# Patient Record
Sex: Female | Born: 1940
Health system: Southern US, Community
[De-identification: ages and names within clinical notes are randomized; demographics above are authoritative.]

## PROBLEM LIST (undated history)

## (undated) DIAGNOSIS — C801 Malignant (primary) neoplasm, unspecified: Secondary | ICD-10-CM

## (undated) DIAGNOSIS — E785 Hyperlipidemia, unspecified: Secondary | ICD-10-CM

## (undated) DIAGNOSIS — R7309 Other abnormal glucose: Secondary | ICD-10-CM

## (undated) DIAGNOSIS — R42 Dizziness and giddiness: Secondary | ICD-10-CM

## (undated) DIAGNOSIS — M2550 Pain in unspecified joint: Secondary | ICD-10-CM

## (undated) DIAGNOSIS — N19 Unspecified kidney failure: Secondary | ICD-10-CM

## (undated) DIAGNOSIS — Z853 Personal history of malignant neoplasm of breast: Secondary | ICD-10-CM

## (undated) DIAGNOSIS — Z87891 Personal history of nicotine dependence: Secondary | ICD-10-CM

## (undated) DIAGNOSIS — I10 Essential (primary) hypertension: Secondary | ICD-10-CM

## (undated) DIAGNOSIS — Z780 Asymptomatic menopausal state: Secondary | ICD-10-CM

## (undated) HISTORY — DX: Pain in unspecified joint: M25.50

## (undated) HISTORY — DX: Personal history of malignant neoplasm of breast: Z85.3

## (undated) HISTORY — DX: Other abnormal glucose: R73.09

## (undated) HISTORY — DX: Unspecified kidney failure: N19

## (undated) HISTORY — DX: Hyperlipidemia, unspecified: E78.5

## (undated) HISTORY — DX: Essential (primary) hypertension: I10

## (undated) HISTORY — DX: Asymptomatic menopausal state: Z78.0

## (undated) HISTORY — DX: Personal history of nicotine dependence: Z87.891

## (undated) HISTORY — DX: Dizziness and giddiness: R42

## (undated) HISTORY — PX: BREAST LUMPECTOMY: SHX2

---

## 1994-09-14 HISTORY — PX: OTHER SURGICAL HISTORY: SHX169

## 1994-09-14 HISTORY — PX: CEREBRAL ANEURYSM REPAIR: SHX164

## 1997-12-05 ENCOUNTER — Ambulatory Visit (HOSPITAL_COMMUNITY): Admission: RE | Admit: 1997-12-05 | Discharge: 1997-12-05 | Payer: Self-pay | Admitting: Obstetrics and Gynecology

## 1998-11-01 ENCOUNTER — Encounter: Payer: Self-pay | Admitting: Emergency Medicine

## 1998-11-01 ENCOUNTER — Emergency Department (HOSPITAL_COMMUNITY): Admission: EM | Admit: 1998-11-01 | Discharge: 1998-11-01 | Payer: Self-pay | Admitting: Emergency Medicine

## 1999-02-27 ENCOUNTER — Other Ambulatory Visit: Admission: RE | Admit: 1999-02-27 | Discharge: 1999-02-27 | Payer: Self-pay | Admitting: Obstetrics and Gynecology

## 1999-02-27 ENCOUNTER — Encounter: Payer: Self-pay | Admitting: Obstetrics and Gynecology

## 1999-02-27 ENCOUNTER — Ambulatory Visit (HOSPITAL_COMMUNITY): Admission: RE | Admit: 1999-02-27 | Discharge: 1999-02-27 | Payer: Self-pay | Admitting: Obstetrics and Gynecology

## 2000-03-29 ENCOUNTER — Encounter: Payer: Self-pay | Admitting: Emergency Medicine

## 2000-03-29 ENCOUNTER — Emergency Department (HOSPITAL_COMMUNITY): Admission: EM | Admit: 2000-03-29 | Discharge: 2000-03-29 | Payer: Self-pay | Admitting: Emergency Medicine

## 2001-06-28 ENCOUNTER — Ambulatory Visit (HOSPITAL_COMMUNITY): Admission: RE | Admit: 2001-06-28 | Discharge: 2001-06-28 | Payer: Self-pay | Admitting: *Deleted

## 2001-07-01 ENCOUNTER — Encounter: Admission: RE | Admit: 2001-07-01 | Discharge: 2001-07-01 | Payer: Self-pay | Admitting: *Deleted

## 2001-07-01 ENCOUNTER — Encounter (INDEPENDENT_AMBULATORY_CARE_PROVIDER_SITE_OTHER): Payer: Self-pay | Admitting: *Deleted

## 2001-07-01 ENCOUNTER — Other Ambulatory Visit: Admission: RE | Admit: 2001-07-01 | Discharge: 2001-07-01 | Payer: Self-pay | Admitting: Radiology

## 2001-07-07 ENCOUNTER — Encounter: Admission: RE | Admit: 2001-07-07 | Discharge: 2001-07-07 | Payer: Self-pay | Admitting: General Surgery

## 2001-07-07 ENCOUNTER — Encounter: Payer: Self-pay | Admitting: General Surgery

## 2001-07-08 ENCOUNTER — Encounter: Payer: Self-pay | Admitting: General Surgery

## 2001-07-08 ENCOUNTER — Ambulatory Visit (HOSPITAL_BASED_OUTPATIENT_CLINIC_OR_DEPARTMENT_OTHER): Admission: RE | Admit: 2001-07-08 | Discharge: 2001-07-08 | Payer: Self-pay | Admitting: General Surgery

## 2001-07-08 ENCOUNTER — Encounter: Admission: RE | Admit: 2001-07-08 | Discharge: 2001-07-08 | Payer: Self-pay | Admitting: General Surgery

## 2001-07-08 ENCOUNTER — Encounter (INDEPENDENT_AMBULATORY_CARE_PROVIDER_SITE_OTHER): Payer: Self-pay | Admitting: *Deleted

## 2001-07-19 ENCOUNTER — Ambulatory Visit: Admission: RE | Admit: 2001-07-19 | Discharge: 2001-10-17 | Payer: Self-pay | Admitting: Radiation Oncology

## 2001-08-05 ENCOUNTER — Ambulatory Visit (HOSPITAL_COMMUNITY): Admission: RE | Admit: 2001-08-05 | Discharge: 2001-08-05 | Payer: Self-pay | Admitting: Oncology

## 2001-08-05 ENCOUNTER — Encounter: Payer: Self-pay | Admitting: Oncology

## 2001-08-22 ENCOUNTER — Ambulatory Visit (HOSPITAL_BASED_OUTPATIENT_CLINIC_OR_DEPARTMENT_OTHER): Admission: RE | Admit: 2001-08-22 | Discharge: 2001-08-22 | Payer: Self-pay | Admitting: General Surgery

## 2001-08-22 ENCOUNTER — Encounter: Payer: Self-pay | Admitting: General Surgery

## 2001-10-18 ENCOUNTER — Ambulatory Visit: Admission: RE | Admit: 2001-10-18 | Discharge: 2002-01-16 | Payer: Self-pay | Admitting: Radiation Oncology

## 2001-10-26 ENCOUNTER — Ambulatory Visit (HOSPITAL_BASED_OUTPATIENT_CLINIC_OR_DEPARTMENT_OTHER): Admission: RE | Admit: 2001-10-26 | Discharge: 2001-10-26 | Payer: Self-pay | Admitting: General Surgery

## 2002-05-17 ENCOUNTER — Ambulatory Visit (HOSPITAL_COMMUNITY): Admission: RE | Admit: 2002-05-17 | Discharge: 2002-05-17 | Payer: Self-pay | Admitting: Oncology

## 2002-06-29 ENCOUNTER — Encounter: Payer: Self-pay | Admitting: General Surgery

## 2002-06-29 ENCOUNTER — Encounter: Admission: RE | Admit: 2002-06-29 | Discharge: 2002-06-29 | Payer: Self-pay | Admitting: General Surgery

## 2003-07-02 ENCOUNTER — Encounter: Payer: Self-pay | Admitting: Oncology

## 2003-07-02 ENCOUNTER — Encounter: Admission: RE | Admit: 2003-07-02 | Discharge: 2003-07-02 | Payer: Self-pay | Admitting: Oncology

## 2004-07-02 ENCOUNTER — Encounter: Admission: RE | Admit: 2004-07-02 | Discharge: 2004-07-02 | Payer: Self-pay

## 2004-07-22 ENCOUNTER — Ambulatory Visit: Payer: Self-pay | Admitting: Endocrinology

## 2004-07-24 ENCOUNTER — Ambulatory Visit: Payer: Self-pay | Admitting: Endocrinology

## 2004-07-24 ENCOUNTER — Other Ambulatory Visit: Admission: RE | Admit: 2004-07-24 | Discharge: 2004-07-24 | Payer: Self-pay | Admitting: Endocrinology

## 2004-08-19 ENCOUNTER — Ambulatory Visit: Payer: Self-pay | Admitting: Oncology

## 2005-02-25 ENCOUNTER — Ambulatory Visit: Payer: Self-pay | Admitting: Oncology

## 2005-06-25 ENCOUNTER — Ambulatory Visit: Payer: Self-pay | Admitting: Endocrinology

## 2005-07-03 ENCOUNTER — Encounter: Admission: RE | Admit: 2005-07-03 | Discharge: 2005-07-03 | Payer: Self-pay | Admitting: Oncology

## 2005-08-20 ENCOUNTER — Ambulatory Visit: Payer: Self-pay | Admitting: Oncology

## 2006-02-07 ENCOUNTER — Ambulatory Visit: Payer: Self-pay | Admitting: Oncology

## 2006-07-06 ENCOUNTER — Encounter: Admission: RE | Admit: 2006-07-06 | Discharge: 2006-07-06 | Payer: Self-pay | Admitting: Endocrinology

## 2006-07-13 ENCOUNTER — Ambulatory Visit: Payer: Self-pay | Admitting: Endocrinology

## 2006-08-11 ENCOUNTER — Ambulatory Visit: Payer: Self-pay | Admitting: Oncology

## 2006-08-13 LAB — CANCER ANTIGEN 27.29: CA 27.29: 34 U/mL (ref 0–39)

## 2006-08-13 LAB — CBC WITH DIFFERENTIAL/PLATELET
Basophils Absolute: 0 10*3/uL (ref 0.0–0.1)
Eosinophils Absolute: 0.2 10*3/uL (ref 0.0–0.5)
HGB: 15 g/dL (ref 11.6–15.9)
LYMPH%: 25 % (ref 14.0–48.0)
MCV: 94.1 fL (ref 81.0–101.0)
MONO#: 0.4 10*3/uL (ref 0.1–0.9)
MONO%: 6.1 % (ref 0.0–13.0)
NEUT#: 4.4 10*3/uL (ref 1.5–6.5)
Platelets: 244 10*3/uL (ref 145–400)
RDW: 14.9 % — ABNORMAL HIGH (ref 11.3–14.5)
WBC: 6.8 10*3/uL (ref 3.9–10.0)

## 2006-08-13 LAB — COMPREHENSIVE METABOLIC PANEL
ALT: 13 U/L (ref 0–35)
CO2: 23 mEq/L (ref 19–32)
Calcium: 9.4 mg/dL (ref 8.4–10.5)
Chloride: 105 mEq/L (ref 96–112)
Glucose, Bld: 99 mg/dL (ref 70–99)
Sodium: 140 mEq/L (ref 135–145)
Total Bilirubin: 0.5 mg/dL (ref 0.3–1.2)
Total Protein: 7 g/dL (ref 6.0–8.3)

## 2007-06-18 ENCOUNTER — Encounter: Payer: Self-pay | Admitting: *Deleted

## 2007-06-18 DIAGNOSIS — Z853 Personal history of malignant neoplasm of breast: Secondary | ICD-10-CM | POA: Insufficient documentation

## 2007-06-18 DIAGNOSIS — I10 Essential (primary) hypertension: Secondary | ICD-10-CM

## 2007-06-18 DIAGNOSIS — E785 Hyperlipidemia, unspecified: Secondary | ICD-10-CM

## 2007-06-18 DIAGNOSIS — R7309 Other abnormal glucose: Secondary | ICD-10-CM

## 2007-06-18 DIAGNOSIS — R739 Hyperglycemia, unspecified: Secondary | ICD-10-CM | POA: Insufficient documentation

## 2007-06-18 HISTORY — DX: Essential (primary) hypertension: I10

## 2007-06-18 HISTORY — DX: Hyperlipidemia, unspecified: E78.5

## 2007-06-18 HISTORY — DX: Personal history of malignant neoplasm of breast: Z85.3

## 2007-06-18 HISTORY — DX: Other abnormal glucose: R73.09

## 2007-07-11 ENCOUNTER — Encounter: Admission: RE | Admit: 2007-07-11 | Discharge: 2007-07-11 | Payer: Self-pay | Admitting: Endocrinology

## 2008-01-12 ENCOUNTER — Ambulatory Visit: Payer: Self-pay | Admitting: Endocrinology

## 2008-01-12 DIAGNOSIS — M2550 Pain in unspecified joint: Secondary | ICD-10-CM

## 2008-01-12 HISTORY — DX: Pain in unspecified joint: M25.50

## 2008-01-12 LAB — CONVERTED CEMR LAB
ALT: 16 units/L (ref 0–35)
AST: 23 units/L (ref 0–37)
Basophils Absolute: 0.1 10*3/uL (ref 0.0–0.1)
Bilirubin, Direct: 0.1 mg/dL (ref 0.0–0.3)
CO2: 29 meq/L (ref 19–32)
Chloride: 101 meq/L (ref 96–112)
Cholesterol: 225 mg/dL (ref 0–200)
Creatinine, Ser: 1.3 mg/dL — ABNORMAL HIGH (ref 0.4–1.2)
Direct LDL: 140 mg/dL
GFR calc non Af Amer: 44 mL/min
HDL: 54.2 mg/dL (ref >39.0)
Hemoglobin: 13.4 g/dL (ref 12.0–15.0)
Hgb A1c MFr Bld: 5.6 % (ref 4.6–6.0)
Lymphocytes Relative: 22.3 % (ref 12.0–46.0)
MCHC: 34.2 g/dL (ref 30.0–36.0)
Monocytes Relative: 6.5 % (ref 3.0–12.0)
Mucus, UA: NEGATIVE
Neutro Abs: 5.8 10*3/uL (ref 1.4–7.7)
Neutrophils Relative %: 68.5 % (ref 43.0–77.0)
Nitrite: NEGATIVE
Platelets: 205 10*3/uL (ref 150–400)
RBC / HPF: NONE SEEN
RDW: 13.6 % (ref 11.5–14.6)
Sed Rate: 17 mm/hr (ref 0–22)
Sodium: 141 meq/L (ref 135–145)
Specific Gravity, Urine: 1.01 (ref 1.000–1.03)
TSH: 0.87 microintl units/mL (ref 0.35–5.50)
Total Bilirubin: 0.6 mg/dL (ref 0.3–1.2)
Total CHOL/HDL Ratio: 4.2
Total Protein, Urine: NEGATIVE mg/dL
VLDL: 38 mg/dL (ref 0–40)
pH: 6 (ref 5.0–8.0)

## 2008-01-19 ENCOUNTER — Ambulatory Visit: Payer: Self-pay | Admitting: Endocrinology

## 2008-01-19 DIAGNOSIS — Z780 Asymptomatic menopausal state: Secondary | ICD-10-CM | POA: Insufficient documentation

## 2008-01-19 HISTORY — DX: Asymptomatic menopausal state: Z78.0

## 2008-07-11 ENCOUNTER — Encounter: Admission: RE | Admit: 2008-07-11 | Discharge: 2008-07-11 | Payer: Self-pay | Admitting: Endocrinology

## 2009-01-01 ENCOUNTER — Ambulatory Visit: Payer: Self-pay | Admitting: Endocrinology

## 2009-01-15 ENCOUNTER — Telehealth (INDEPENDENT_AMBULATORY_CARE_PROVIDER_SITE_OTHER): Payer: Self-pay | Admitting: *Deleted

## 2009-01-21 ENCOUNTER — Ambulatory Visit: Payer: Self-pay | Admitting: Endocrinology

## 2009-01-24 ENCOUNTER — Telehealth (INDEPENDENT_AMBULATORY_CARE_PROVIDER_SITE_OTHER): Payer: Self-pay | Admitting: *Deleted

## 2009-07-12 ENCOUNTER — Encounter: Admission: RE | Admit: 2009-07-12 | Discharge: 2009-07-12 | Payer: Self-pay | Admitting: Endocrinology

## 2010-01-30 ENCOUNTER — Ambulatory Visit: Payer: Self-pay | Admitting: Endocrinology

## 2010-01-30 DIAGNOSIS — Z87891 Personal history of nicotine dependence: Secondary | ICD-10-CM

## 2010-01-30 HISTORY — DX: Personal history of nicotine dependence: Z87.891

## 2010-01-31 LAB — CONVERTED CEMR LAB
AST: 23 units/L (ref 0–37)
Albumin: 3.9 g/dL (ref 3.5–5.2)
Alkaline Phosphatase: 54 units/L (ref 39–117)
Basophils Relative: 1 % (ref 0.0–3.0)
Bilirubin Urine: NEGATIVE
CO2: 27 meq/L (ref 19–32)
Calcium: 9.9 mg/dL (ref 8.4–10.5)
GFR calc non Af Amer: 35.12 mL/min (ref >60)
HDL: 70.9 mg/dL (ref >39.00)
Hemoglobin, Urine: NEGATIVE
Hemoglobin: 13 g/dL (ref 12.0–15.0)
Lymphocytes Relative: 19.3 % (ref 12.0–46.0)
MCHC: 34.1 g/dL (ref 30.0–36.0)
Monocytes Relative: 6.3 % (ref 3.0–12.0)
Neutro Abs: 5.3 10*3/uL (ref 1.4–7.7)
Potassium: 5.1 meq/L (ref 3.5–5.1)
RBC: 4.11 M/uL (ref 3.87–5.11)
Sodium: 141 meq/L (ref 135–145)
Total CHOL/HDL Ratio: 2
Total Protein, Urine: NEGATIVE mg/dL
Total Protein: 7.1 g/dL (ref 6.0–8.3)
Urine Glucose: NEGATIVE mg/dL

## 2010-07-14 ENCOUNTER — Encounter: Admission: RE | Admit: 2010-07-14 | Discharge: 2010-07-14 | Payer: Self-pay | Admitting: Endocrinology

## 2010-07-14 LAB — HM MAMMOGRAPHY: HM Mammogram: NEGATIVE

## 2010-10-04 ENCOUNTER — Encounter: Payer: Self-pay | Admitting: Endocrinology

## 2010-10-12 LAB — CONVERTED CEMR LAB
ALT: 16 units/L (ref 0–35)
BUN: 34 mg/dL — ABNORMAL HIGH (ref 6–23)
Basophils Absolute: 0.1 10*3/uL (ref 0.0–0.1)
Chloride: 107 meq/L (ref 96–112)
Cholesterol: 244 mg/dL — ABNORMAL HIGH (ref 0–200)
Creatinine, Ser: 1.3 mg/dL — ABNORMAL HIGH (ref 0.4–1.2)
Eosinophils Absolute: 0.1 10*3/uL (ref 0.0–0.7)
Glucose, Bld: 91 mg/dL (ref 70–99)
HCT: 39.3 % (ref 36.0–46.0)
HDL: 71.9 mg/dL (ref >39.00)
Hgb A1c MFr Bld: 5.8 % (ref 4.6–6.5)
Ketones, ur: NEGATIVE mg/dL
Lymphs Abs: 1.8 10*3/uL (ref 0.7–4.0)
MCHC: 34.7 g/dL (ref 30.0–36.0)
MCV: 93.7 fL (ref 78.0–100.0)
Monocytes Absolute: 0.6 10*3/uL (ref 0.1–1.0)
Neutrophils Relative %: 64.9 % (ref 43.0–77.0)
Nitrite: NEGATIVE
Platelets: 213 10*3/uL (ref 150.0–400.0)
Potassium: 4.6 meq/L (ref 3.5–5.1)
RDW: 13.6 % (ref 11.5–14.6)
Specific Gravity, Urine: 1.02 (ref 1.000–1.030)
TSH: 0.84 microintl units/mL (ref 0.35–5.50)
Total Bilirubin: 0.8 mg/dL (ref 0.3–1.2)
Urobilinogen, UA: 0.2 (ref 0.0–1.0)

## 2010-10-16 NOTE — Assessment & Plan Note (Signed)
Summary: YEARLY/MEDICARE/CD   Vital Signs:  Patient profile:   70 year old female Height:      64 inches (162.56 cm) Weight:      185 pounds (84.09 kg) BMI:     31.87 O2 Sat:      95 % on Room air Temp:     98.0 degrees F (36.67 degrees C) oral Pulse rate:   80 / minute BP sitting:   128 / 82  (right arm) Cuff size:   large  Vitals Entered By: Josph Macho RMA (Jan 30, 2010 8:29 AM)  O2 Flow:  Room air CC: Physical/ pt wants meds refilled/ pt doesn't want pap or EKG/ CF Is Patient Diabetic? No   CC:  Physical/ pt wants meds refilled/ pt doesn't want pap or EKG/ CF.  History of Present Illness: here for regular wellness examination.  she's feeling pretty well in general, and does not drink or smoke.   Current Medications (verified): 1)  Calcium 600 1500 Mg  Tabs (Calcium Carbonate) .... Take 1 By Mouth Qd 2)  Zestoretic 20-12.5 Mg  Tabs (Lisinopril-Hydrochlorothiazide) .... Take 2 By Mouth Qd 3)  Fish Oil Concentrate 300 Mg Caps (Omega-3 Fatty Acids) .Marland Kitchen.. 1 Cap Daily 4)  Aspirin Adult Low Strength 81 Mg Tbec (Aspirin) .Marland Kitchen.. 1 Daily 5)  Zostavax 16109 Unt/0.85ml Solr (Zoster Vaccine Live) .... Im 6)  Simvastatin 40 Mg Tabs (Simvastatin) .Marland Kitchen.. 1 Qhs  Allergies (verified): No Known Drug Allergies  Past History:  Past Medical History: Last updated: 06/18/2007 Breast cancer, hx of Hyperlipidemia Hypertension  Family History: Reviewed history from 01/19/2008 and no changes required. cancer:  none except for patient  Social History: Reviewed history from 01/19/2008 and no changes required. 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    Physical Exam  General:   normal appearance.   Head:  head: no deformity eyes: no periorbital swelling, no proptosis external nose and ears are normal mouth: no lesion seen Neck:  Supple without thyroid enlargement or tenderness.  Breasts:  refused Lungs:  Clear to auscultation bilaterally. Normal respiratory effort.  Heart:  Regular rate and rhythm without murmurs or gallops noted. Normal S1,S2.   Abdomen:  abdomen is soft, nontender.  no hepatosplenomegaly.   not distended.  no hernia  Rectal:  refused Genitalia:  refused Msk:  muscle bulk and strength are grossly normal.  no obvious joint swelling.  gait is normal and steady  Pulses:  dorsalis pedis intact bilat.  no carotid bruit  Extremities:  no deformity.  no ulcer on the feet.  feet are of normal color and temp.  no edema  Neurologic:  cn 2-12 grossly intact.   readily moves all 4's.   sensation is intact to touch on the feet  Skin:  normal texture and temp.  no rash.  not diaphoretic  Cervical Nodes:  No significant adenopathy.  Psych:  Alert and cooperative; normal mood and affect; normal attention span and concentration.     Impression & Recommendations:  Problem # 1:  ROUTINE GENERAL MEDICAL EXAM@HEALTH  CARE FACL (ICD-V70.0)  Other Orders: TLB-Lipid Panel (80061-LIPID) TLB-BMP (Basic Metabolic Panel-BMET) (80048-METABOL) TLB-CBC Platelet - w/Differential (85025-CBCD) TLB-Hepatic/Liver Function Pnl (80076-HEPATIC) TLB-TSH (Thyroid Stimulating Hormone) (84443-TSH) TLB-A1C / Hgb A1C (Glycohemoglobin) (83036-A1C) TLB-Udip w/ Micro (81001-URINE)  Est. Patient 65& > (13086)  Preventive Care Screening     colonoscopy refused 2011   Patient Instructions: 1)  blood tests are being ordered for you today.   please call [phone] to hear your test results. 2)  pending the test results, please continue the same medications for now 3)  please consider these measures for your health:  minimize alcohol.  do not use tobacco products.  have a  colonoscopy at least every 10 years from age 12.  keep firearms safely stored.  always use seat belts.  have working smoke alarms in your home.  see the dentist regularly.  never drive under the influence of alcohol or drugs (including prescription drugs).  those with fair skin should take precautions against the sun. 4)  please let me know what your wishes would be, if artificial life support measures should become necessary.  it is critically important to prevent falling down (keep floor areas well-lit, dry, and free of loose objects) Prescriptions: SIMVASTATIN 40 MG TABS (SIMVASTATIN) 1 qhs  #90 x 3   Entered and Authorized by:   Minus Breeding MD   Signed by:   Minus Breeding MD on 01/30/2010   Method used:   [facility] to        [facility]  [address] [phone]* (retail)       [address]       [place], [place]  [address]       Ph: [phone] or [phone]       Fax: [phone]   RxID:   [id number] ZESTORETIC 20-12.5 MG  TABS (LISINOPRIL-HYDROCHLOROTHIAZIDE) take 2 by mouth qd  #180 x 3   Entered and Authorized by:   Minus Breeding MD   Signed by:   Minus Breeding MD on 01/30/2010   Method used:   [facility]        [facility]  [address] [phone][facility] (retail[facility]       [address]       [place], [place]  [address]       Ph: [phone] or [phone]       Fax: [phone]   RxID:   [id number]

## 2011-01-30 NOTE — Op Note (Signed)
[place]. [facility]  Patient:    NAYZETH, ALTMAN Visit Number: [id number] MRN: [id number]          Service Type: DSU Location: [facility] Attending Physician:  Janalyn Rouse Dictated by:   Rose Phi. Maple Hudson, M.D. Proc. Date: 08/22/01 Admit Date:  08/22/2001   CC:         Valentino Hue. Magrinat, M.D.   Operative Report  PREOPERATIVE DIAGNOSIS:  Carcinoma of the left breast.  POSTOPERATIVE DIAGNOSIS:  Carcinoma of the left breast.  OPERATION:  Insertion of Port-A-Cath.  SURGEON:  Rose Phi. Maple Hudson, M.D.  ANESTHESIA:  MAC.  OPERATIVE PROCEDURE:  The patient was placed on the operating table and the right upper chest was prepped and draped in the usual fashion.  Under local anesthesia the right subclavian puncture was carried out without difficulty and a guide wire was inserted and proper positioning confirmed by fluoroscopy.  Under local anesthesia, the transverse incision was made on the upper chest wall and a pocket developed for the implantable port.  We then tunneled between the subclavian puncture site and the port area and passed the preconnected BARD catheter and then fixed the port in the pocket with two 2-0 Prolene sutures.  The catheter tip was trimmed for the appropriate length.  A dilator and peel-away sheath were then passed over the wire and the wire was removed followed by the dilator and the catheter passed through the peel-away sheath which was then removed.  Again, C-arm fluoroscopy was used to confirm the tip placement and that there was no kinking in the system.  System flushed easily.  Incisions were closed with 3-0 Vicryl and subcuticular portal with monocryl with Steri-Strips.  System was fully heparinized.  Dressings were applied and the patient was transferred to the recovery room in satisfactory condition having tolerated the procedure well. Dictated by:   Rose Phi. Maple Hudson, M.D. Attending Physician:  Janalyn Rouse DD:   08/22/01 TD:  08/22/01 Job: [id number]

## 2011-01-30 NOTE — Op Note (Signed)
[place]. [facility]  Patient:    Amber Hampton, Amber Hampton Visit Number: [id number] MRN: [id number]          Service Type: DSU Location: [facility] Attending Physician:  Janalyn Rouse Dictated by:   Rose Phi. Maple Hudson, M.D. Proc. Date: 07/08/01 Admit Date:  07/08/2001 Discharge Date: 07/08/2001                             Operative Report  PREOPERATIVE DIAGNOSIS:  Small carcinoma of the left breast.  POSTOPERATIVE DIAGNOSIS:  Small carcinoma of the left breast.  OPERATION: 1. Blue dye injection. 2. Left partial mastectomy with needle localization and specimen mammography. 3. Left axillary sentinel lymph node biopsy.  SURGEON:  Rose Phi. Maple Hudson, M.D.  ANESTHESIA:  General.  DESCRIPTION OF PROCEDURE:  Prior to coming to the operating room, the patient had a wire localization of a small tumor in the upper outer quadrant of the left breast, and a 1 millicurie of technetium sulfur colloid injected intradermally.  After suitable general anesthesia was induced, the patient was placed in the supine position with the left arm extended on the arm board.  Five cubic centimeters of lymphazurin blue was injected in the subareolar tissue and the breast massaged for five minutes.  We then prepped and draped the breast and the axilla in the standard fashion. We previously marked on the breast where the location of the tumor was, and the guidewire coming from beneath it and up to it.  A curved incision was made to deliver the wire into the incision, and then did a wide excision around the wire and the surrounding tissue.  We oriented the specimen for the pathologist and submitted it for margins.  Hemostasis obtained with the cautery.  While that was being done, we scanned the axilla which showed a hot spot in the mid axilla.  A short transverse incision was made in the axilla with dissection through the subcutaneous tissue to the clavipectoral fascia.  I could then identify  the blue lymphatic, and by dissecting along the blue lymphatic, and using the Neoprobe as a guide, we identified three hot blue nodes, all of which were removed.  Hemostasis was good.  The subcutaneous tissue was closed with 3-0 Vicryl and the skin with subcuticular 4-0 Monocryl and Steri-Strips.  Specimen mammography confirmed the removal of the lesion and we closed the partial mastectomy incision with 4-0 Monocryl and Steri-Strips.  Touch preps of the margins were clean and touch preps of the three sentinel nodes were also clean.  Dressings were applied and the patient transferred to the recovery room in satisfactory condition having tolerated the procedure well. Dictated by:   Rose Phi. Maple Hudson, M.D. Attending Physician:  Janalyn Rouse DD:  07/08/01 TD:  07/11/01 Job: [id number]

## 2011-02-19 ENCOUNTER — Other Ambulatory Visit (INDEPENDENT_AMBULATORY_CARE_PROVIDER_SITE_OTHER): Payer: Medicare Other

## 2011-02-19 ENCOUNTER — Encounter: Payer: Self-pay | Admitting: Endocrinology

## 2011-02-19 ENCOUNTER — Ambulatory Visit (INDEPENDENT_AMBULATORY_CARE_PROVIDER_SITE_OTHER): Payer: Medicare Other | Admitting: Endocrinology

## 2011-02-19 DIAGNOSIS — I10 Essential (primary) hypertension: Secondary | ICD-10-CM

## 2011-02-19 DIAGNOSIS — E785 Hyperlipidemia, unspecified: Secondary | ICD-10-CM

## 2011-02-19 DIAGNOSIS — Z79899 Other long term (current) drug therapy: Secondary | ICD-10-CM

## 2011-02-19 DIAGNOSIS — R7309 Other abnormal glucose: Secondary | ICD-10-CM

## 2011-02-19 LAB — URINALYSIS, ROUTINE W REFLEX MICROSCOPIC
Bilirubin Urine: NEGATIVE
Hgb urine dipstick: NEGATIVE
Urine Glucose: NEGATIVE
Urobilinogen, UA: 0.2 (ref 0.0–1.0)

## 2011-02-19 LAB — LIPID PANEL
Cholesterol: 168 mg/dL (ref 0–200)
HDL: 72.5 mg/dL (ref >39.00)
LDL Cholesterol: 74 mg/dL (ref 0–99)
Triglycerides: 107 mg/dL (ref 0.0–149.0)
VLDL: 21.4 mg/dL (ref 0.0–40.0)

## 2011-02-19 LAB — CBC WITH DIFFERENTIAL/PLATELET
Basophils Absolute: 0 10*3/uL (ref 0.0–0.1)
Eosinophils Absolute: 0.2 10*3/uL (ref 0.0–0.7)
Lymphocytes Relative: 24.4 % (ref 12.0–46.0)
MCHC: 34.2 g/dL (ref 30.0–36.0)
Monocytes Absolute: 0.6 10*3/uL (ref 0.1–1.0)
Neutrophils Relative %: 64.4 % (ref 43.0–77.0)
Platelets: 188 10*3/uL (ref 150.0–400.0)
RBC: 4.46 Mil/uL (ref 3.87–5.11)
RDW: 14.1 % (ref 11.5–14.6)

## 2011-02-19 LAB — BASIC METABOLIC PANEL
CO2: 26 mEq/L (ref 19–32)
Calcium: 9.9 mg/dL (ref 8.4–10.5)
Creatinine, Ser: 1.4 mg/dL — ABNORMAL HIGH (ref 0.4–1.2)

## 2011-02-19 LAB — TSH: TSH: 1.43 u[IU]/mL (ref 0.35–5.50)

## 2011-02-19 LAB — HEMOGLOBIN A1C: Hgb A1c MFr Bld: 6 % (ref 4.6–6.5)

## 2011-02-19 LAB — HEPATIC FUNCTION PANEL
Bilirubin, Direct: 0.1 mg/dL (ref 0.0–0.3)
Total Bilirubin: 0.6 mg/dL (ref 0.3–1.2)

## 2011-02-19 NOTE — Progress Notes (Signed)
Subjective:    Patient ID: Amber Hampton, female    DOB: 01/31/1941, 70 y.o.   MRN: [id number]  HPI here for regular wellness examination.  He's feeling pretty well in general, and says chronic med probs are stable, except as noted below Past Medical History  Diagnosis Date  . BREAST CANCER, HX OF 06/18/2007  . HYPERLIPIDEMIA 06/18/2007  . HYPERTENSION 06/18/2007  . ARTHRALGIA 01/12/2008  . HYPERGLYCEMIA 06/18/2007  . ASYMPTOMATIC POSTMENOPAUSAL STATUS 01/19/2008  . TOBACCO USE, QUIT 01/30/2010    Past Surgical History  Procedure Date  . Aneurysm right side head     History   Social History  . Marital Status: Married    Spouse Name: N/A    Number of Children: N/A  . Years of Education: N/A   Occupational History  . Receptionist     Works at [employer]   Social History Main Topics  . Smoking status: Former Smoker    Quit date: 09/14/2006  . Smokeless tobacco: Not on file  . Alcohol Use:   . Drug Use:   . Sexually Active:    Other Topics Concern  . Not on file   Social History Narrative  . No narrative on file    Current Outpatient Prescriptions on File Prior to Visit  Medication Sig Dispense Refill  . aspirin 81 MG tablet Take 81 mg by mouth daily.        . calcium carbonate (OS-CAL) 600 MG TABS Take 600 mg by mouth daily.        Marland Kitchen lisinopril-hydrochlorothiazide (PRINZIDE,ZESTORETIC) 20-12.5 MG per tablet Take 2 tablets by mouth daily.       . Omega-3 Fatty Acids (FISH OIL CONCENTRATE) 300 MG CAPS Take 1 capsule by mouth daily.        . simvastatin (ZOCOR) 40 MG tablet Take 40 mg by mouth at bedtime.          No Known Allergies  Family History  Problem Relation Age of Onset  . Cancer Neg Hx     No FH except for pt    BP 122/80  Pulse 67  Temp(Src) 97.7 F (36.5 C) (Oral)  Ht 5\' 4"  (1.626 m)  Wt 191 lb 6.4 oz (86.818 kg)  BMI 32.85 kg/m2  SpO2 95%     Review of Systems  Constitutional:       Pt reports weight gain  HENT: Negative for  hearing loss.   Eyes: Negative for visual disturbance.  Respiratory: Negative for shortness of breath.   Cardiovascular: Negative for chest pain.  Gastrointestinal: Negative for anal bleeding.  Genitourinary: Negative for hematuria.  Musculoskeletal: Negative for arthralgias.  Skin: Negative for rash.  Neurological: Negative for syncope and headaches.  Hematological: Does not bruise/bleed easily.  Psychiatric/Behavioral: Negative for dysphoric mood. The patient is not nervous/anxious.        Objective:   Physical Exam VS: see vs page GEN: no distress HEAD: head: no deformity eyes: no periorbital swelling, no proptosis external nose and ears are normal mouth: no lesion seen NECK: supple, thyroid is not enlarged CHEST WALL: no deformity BREASTS:  Declined. CV: reg rate and rhythm, no murmur ABD: abdomen is soft, nontender.  no hepatosplenomegaly.  not distended.  no hernia GENITALIA:  declined RECTAL: declined MUSCULOSKELETAL: muscle bulk and strength are grossly normal.  no obvious joint swelling.  gait is normal and steady EXTEMITIES: no deformity.  no ulcer on the feet.  feet are of normal color and temp.  no  edema PULSES: dorsalis pedis intact bilat.  no carotid bruit NEURO:  cn 2-12 grossly intact.   readily moves all 4's.  sensation is intact to touch on the feet SKIN:  Normal texture and temperature.  No rash or suspicious lesion is visible.   NODES:  None palpable at the neck PSYCH: alert, oriented x3.  Does not appear anxious nor depressed.    Assessment & Plan:  Wellness visit today, with problems stable, except as noted.

## 2011-02-19 NOTE — Patient Instructions (Addendum)
please consider these measures for your health:  minimize alcohol.  do not use tobacco products.  have a colonoscopy at least every 10 years from age 70.  keep firearms safely stored.  always use seat belts.  have working smoke alarms in your home.  see an eye doctor and dentist regularly.  never drive under the influence of alcohol or drugs (including prescription drugs).  those with fair skin should take precautions against the sun. please let me know what your wishes would be, if artificial life support measures should become necessary.  it is critically important to prevent falling down (keep floor areas well-lit, dry, and free of loose objects). blood tests are being ordered for you today.  please call [phone] to hear your test results.  You will be prompted to enter the 9-digit "MRN" number that appears at the top left of this page, followed by #.  Then you will hear the message. Please call if you decide to have the colonoscopy.  This reduces your chances of dying of colon cancer. Also please call if you decide to do the bone-density x-ray.  Please return in 1 year (update: i left message on phone-tree:  Call if urinary sxs).

## 2011-02-22 ENCOUNTER — Other Ambulatory Visit: Payer: Self-pay | Admitting: Endocrinology

## 2011-06-10 ENCOUNTER — Other Ambulatory Visit: Payer: Self-pay | Admitting: Endocrinology

## 2011-06-10 DIAGNOSIS — Z1231 Encounter for screening mammogram for malignant neoplasm of breast: Secondary | ICD-10-CM

## 2011-07-16 ENCOUNTER — Ambulatory Visit
Admission: RE | Admit: 2011-07-16 | Discharge: 2011-07-16 | Disposition: A | Discharge status: Home / Self Care | Payer: Medicare Other | Source: Ambulatory Visit | Attending: Endocrinology | Admitting: Endocrinology

## 2011-07-16 DIAGNOSIS — Z1231 Encounter for screening mammogram for malignant neoplasm of breast: Secondary | ICD-10-CM

## 2012-03-08 ENCOUNTER — Other Ambulatory Visit: Payer: Self-pay | Admitting: Endocrinology

## 2012-03-09 ENCOUNTER — Other Ambulatory Visit: Payer: Self-pay | Admitting: Endocrinology

## 2012-06-06 ENCOUNTER — Other Ambulatory Visit: Payer: Self-pay | Admitting: Endocrinology

## 2012-06-06 DIAGNOSIS — Z1231 Encounter for screening mammogram for malignant neoplasm of breast: Secondary | ICD-10-CM

## 2012-07-18 ENCOUNTER — Ambulatory Visit
Admission: RE | Admit: 2012-07-18 | Discharge: 2012-07-18 | Disposition: A | Discharge status: Home / Self Care | Payer: Medicare Other | Source: Ambulatory Visit | Attending: Endocrinology | Admitting: Endocrinology

## 2012-07-18 DIAGNOSIS — Z1231 Encounter for screening mammogram for malignant neoplasm of breast: Secondary | ICD-10-CM

## 2012-09-23 ENCOUNTER — Other Ambulatory Visit: Payer: Self-pay

## 2012-09-26 ENCOUNTER — Other Ambulatory Visit: Payer: Self-pay

## 2012-10-05 ENCOUNTER — Ambulatory Visit (INDEPENDENT_AMBULATORY_CARE_PROVIDER_SITE_OTHER): Payer: Medicare Other | Admitting: Endocrinology

## 2012-10-05 ENCOUNTER — Telehealth: Payer: Self-pay | Admitting: Endocrinology

## 2012-10-05 ENCOUNTER — Encounter: Payer: Self-pay | Admitting: Endocrinology

## 2012-10-05 VITALS — BP 134/76 | HR 67 | Temp 97.8°F | Wt 186.0 lb

## 2012-10-05 DIAGNOSIS — R7309 Other abnormal glucose: Secondary | ICD-10-CM

## 2012-10-05 DIAGNOSIS — Z79899 Other long term (current) drug therapy: Secondary | ICD-10-CM

## 2012-10-05 DIAGNOSIS — I10 Essential (primary) hypertension: Secondary | ICD-10-CM

## 2012-10-05 DIAGNOSIS — E785 Hyperlipidemia, unspecified: Secondary | ICD-10-CM

## 2012-10-05 LAB — URINALYSIS, ROUTINE W REFLEX MICROSCOPIC
Bilirubin Urine: NEGATIVE
Ketones, ur: NEGATIVE
Specific Gravity, Urine: 1.025 (ref 1.000–1.030)
Urine Glucose: NEGATIVE
Urobilinogen, UA: 0.2 (ref 0.0–1.0)
pH: 5.5 (ref 5.0–8.0)

## 2012-10-05 LAB — CBC WITH DIFFERENTIAL/PLATELET
Basophils Absolute: 0 10*3/uL (ref 0.0–0.1)
Basophils Relative: 0.6 % (ref 0.0–3.0)
Eosinophils Absolute: 0.3 10*3/uL (ref 0.0–0.7)
HCT: 39.2 % (ref 36.0–46.0)
Hemoglobin: 13.1 g/dL (ref 12.0–15.0)
Lymphocytes Relative: 22.9 % (ref 12.0–46.0)
Lymphs Abs: 1.8 10*3/uL (ref 0.7–4.0)
MCHC: 33.4 g/dL (ref 30.0–36.0)
Neutro Abs: 5.1 10*3/uL (ref 1.4–7.7)
RBC: 4.29 Mil/uL (ref 3.87–5.11)
RDW: 14.3 % (ref 11.5–14.6)

## 2012-10-05 LAB — HEPATIC FUNCTION PANEL
Alkaline Phosphatase: 50 U/L (ref 39–117)
Bilirubin, Direct: 0 mg/dL (ref 0.0–0.3)
Total Bilirubin: 0.4 mg/dL (ref 0.3–1.2)
Total Protein: 7.4 g/dL (ref 6.0–8.3)

## 2012-10-05 LAB — LIPID PANEL
HDL: 63.1 mg/dL (ref >39.00)
Triglycerides: 159 mg/dL — ABNORMAL HIGH (ref 0.0–149.0)
VLDL: 31.8 mg/dL (ref 0.0–40.0)

## 2012-10-05 LAB — BASIC METABOLIC PANEL
Calcium: 9.7 mg/dL (ref 8.4–10.5)
Creatinine, Ser: 1.6 mg/dL — ABNORMAL HIGH (ref 0.4–1.2)
GFR: 33.13 mL/min — ABNORMAL LOW (ref >60.00)
Sodium: 139 mEq/L (ref 135–145)

## 2012-10-05 LAB — TSH: TSH: 1.18 u[IU]/mL (ref 0.35–5.50)

## 2012-10-05 LAB — HEMOGLOBIN A1C: Hgb A1c MFr Bld: 5.9 % (ref 4.6–6.5)

## 2012-10-05 MED ORDER — ZOSTER VACCINE LIVE 19400 UNT/0.65ML ~~LOC~~ SOLR: 0.6500 mL | Freq: Once | SUBCUTANEOUS | Status: DC

## 2012-10-05 NOTE — Progress Notes (Signed)
Subjective:    Patient ID: Amber Hampton, female    DOB: Oct 01, 1940, 72 y.o.   MRN: [id number]  HPI here for regular wellness examination.  He's feeling pretty well in general, and says chronic med probs are stable, except as noted below Past Medical History  Diagnosis Date  . BREAST CANCER, HX OF 06/18/2007  . HYPERLIPIDEMIA 06/18/2007  . HYPERTENSION 06/18/2007  . ARTHRALGIA 01/12/2008  . HYPERGLYCEMIA 06/18/2007  . ASYMPTOMATIC POSTMENOPAUSAL STATUS 01/19/2008  . TOBACCO USE, QUIT 01/30/2010    Past Surgical History  Procedure Date  . Aneurysm right side head     History   Social History  . Marital Status: Married    Spouse Name: N/A    Number of Children: N/A  . Years of Education: N/A   Occupational History  . Receptionist     Works at [employer]   Social History Main Topics  . Smoking status: Former Smoker    Quit date: 09/14/2006  . Smokeless tobacco: Not on file  . Alcohol Use:   . Drug Use:   . Sexually Active:    Other Topics Concern  . Not on file   Social History Narrative  . No narrative on file    Current Outpatient Prescriptions on File Prior to Visit  Medication Sig Dispense Refill  . aspirin 81 MG tablet Take 81 mg by mouth daily.        . calcium carbonate (OS-CAL) 600 MG TABS Take 600 mg by mouth daily.        Marland Kitchen lisinopril-hydrochlorothiazide (PRINZIDE,ZESTORETIC) 20-12.5 MG per tablet TAKE 2 TABLETS BY MOUTH EVERY DAY  180 tablet  1  . Omega-3 Fatty Acids (FISH OIL CONCENTRATE) 300 MG CAPS Take 1 capsule by mouth daily.        . simvastatin (ZOCOR) 40 MG tablet TAKE 1 TABLET AT BEDTIME  90 tablet  1    No Known Allergies  Family History  Problem Relation Age of Onset  . Cancer Neg Hx     No FH except for pt    BP 134/76  Pulse 67  Temp 97.8 F (36.6 C) (Oral)  Wt 186 lb (84.369 kg)  SpO2 96%  Review of Systems  Constitutional: Negative for fever and unexpected weight change.  HENT: Negative for hearing loss.   Eyes:  Negative for visual disturbance.  Respiratory: Negative for shortness of breath.   Cardiovascular: Negative for chest pain.  Gastrointestinal: Negative for anal bleeding.  Genitourinary: Negative for hematuria and vaginal bleeding.  Musculoskeletal: Negative for back pain.  Skin: Negative for rash.  Neurological: Negative for syncope and numbness.  Hematological: Does not bruise/bleed easily.  Psychiatric/Behavioral: Negative for dysphoric mood.      Objective:   Physical Exam VS: see vs page GEN: no distress HEAD: head: no deformity eyes: no periorbital swelling, no proptosis external nose and ears are normal mouth: no lesion seen NECK: supple, thyroid is not enlarged CHEST WALL: no deformity LUNGS:  Clear to auscultation BREASTS:  declined CV: reg rate and rhythm, no murmur ABD: abdomen is soft, nontender.  no hepatosplenomegaly.  not distended.  no hernia GENITALIA/RECTAL: declined MUSCULOSKELETAL: muscle bulk and strength are grossly normal.  no obvious joint swelling.  gait is normal and steady EXTEMITIES: no deformity.  no ulcer on the feet.  feet are of normal color and temp.  no edema PULSES: dorsalis pedis intact bilat.  no carotid bruit NEURO:  cn 2-12 grossly intact.   readily moves all  4's.  sensation is intact to touch on the feet SKIN:  Normal texture and temperature.  No rash or suspicious lesion is visible.   NODES:  None palpable at the neck PSYCH: alert, oriented x3.  Does not appear anxious nor depressed.     Assessment & Plan:  Wellness visit today, with problems stable, except as noted. we discussed code status.  pt requests full code, but would not want to be started or maintained on artificial life-support measures if there was not a reasonable chance of recovery

## 2012-10-05 NOTE — Patient Instructions (Addendum)
please consider these measures for your health:  minimize alcohol.  do not use tobacco products.  have a colonoscopy at least every 10 years from age 71.  Women should have an annual mammogram from age 30.  keep firearms safely stored.  always use seat belts.  have working smoke alarms in your home.  see an eye doctor and dentist regularly.  never drive under the influence of alcohol or drugs (including prescription drugs).  those with fair skin should take precautions against the sun.  please let me know what your wishes would be, if artificial life support measures should become necessary.  it is critically important to prevent falling down (keep floor areas well-lit, dry, and free of loose objects.  If you have a cane, walker, or wheelchair, you should use it, even for short trips around the house.  Also, try not to rush).  blood tests are being requested for you today.  We'll contact you with results.  Please call if you decide to do the colonoscopy, as this reduces your chances of dying of cancer.  You should have a vaccine against shingles (a painful rash which results from the  chickenpox infection which most people had many years ago).  This vaccine reduces, but does not totally eliminate the risk of shingles.  Because this is a medicare part d benefit, you should get it at a pharmacy.   Please return in 1 year.

## 2012-10-05 NOTE — Telephone Encounter (Signed)
The patient would like to know if [facility] [place] would [facility] the Shingles [facility] and be able to inject the vaccine, and if so she would like the rx sent there along with her other meds today.  The patient may be reached at [phone] if needed.

## 2012-10-10 ENCOUNTER — Other Ambulatory Visit: Payer: Self-pay | Admitting: *Deleted

## 2012-10-10 MED ORDER — LISINOPRIL-HYDROCHLOROTHIAZIDE 20-12.5 MG PO TABS: 2.0000 | ORAL_TABLET | Freq: Every day | ORAL | Status: DC

## 2012-10-10 MED ORDER — SIMVASTATIN 40 MG PO TABS: 40.0000 mg | ORAL_TABLET | Freq: Every day | ORAL | Status: DC

## 2012-10-21 ENCOUNTER — Encounter: Payer: Self-pay | Admitting: Endocrinology

## 2012-10-29 ENCOUNTER — Other Ambulatory Visit: Payer: Self-pay

## 2013-06-19 ENCOUNTER — Other Ambulatory Visit: Payer: Self-pay

## 2013-06-19 DIAGNOSIS — Z1231 Encounter for screening mammogram for malignant neoplasm of breast: Secondary | ICD-10-CM

## 2013-07-20 ENCOUNTER — Other Ambulatory Visit: Payer: Self-pay

## 2013-07-20 ENCOUNTER — Ambulatory Visit
Admission: RE | Admit: 2013-07-20 | Discharge: 2013-07-20 | Disposition: A | Discharge status: Home / Self Care | Payer: Medicare Other | Source: Ambulatory Visit

## 2013-07-20 DIAGNOSIS — Z1231 Encounter for screening mammogram for malignant neoplasm of breast: Secondary | ICD-10-CM

## 2013-09-29 ENCOUNTER — Other Ambulatory Visit: Payer: Self-pay

## 2013-09-29 MED ORDER — LISINOPRIL-HYDROCHLOROTHIAZIDE 20-12.5 MG PO TABS: 2.0000 | ORAL_TABLET | Freq: Every day | ORAL | Status: DC

## 2013-09-29 MED ORDER — SIMVASTATIN 40 MG PO TABS: 40.0000 mg | ORAL_TABLET | Freq: Every day | ORAL | Status: DC

## 2013-10-05 ENCOUNTER — Ambulatory Visit (INDEPENDENT_AMBULATORY_CARE_PROVIDER_SITE_OTHER): Payer: Medicare HMO | Admitting: Endocrinology

## 2013-10-05 ENCOUNTER — Encounter: Payer: Self-pay | Admitting: Endocrinology

## 2013-10-05 VITALS — BP 114/80 | HR 98 | Temp 98.0°F | Ht 64.0 in | Wt 183.0 lb

## 2013-10-05 DIAGNOSIS — I10 Essential (primary) hypertension: Secondary | ICD-10-CM

## 2013-10-05 DIAGNOSIS — E785 Hyperlipidemia, unspecified: Secondary | ICD-10-CM

## 2013-10-05 DIAGNOSIS — Z0000 Encounter for general adult medical examination without abnormal findings: Secondary | ICD-10-CM

## 2013-10-05 DIAGNOSIS — N289 Disorder of kidney and ureter, unspecified: Secondary | ICD-10-CM | POA: Insufficient documentation

## 2013-10-05 DIAGNOSIS — Z79899 Other long term (current) drug therapy: Secondary | ICD-10-CM

## 2013-10-05 DIAGNOSIS — R7309 Other abnormal glucose: Secondary | ICD-10-CM

## 2013-10-05 LAB — URINALYSIS, ROUTINE W REFLEX MICROSCOPIC
Bilirubin Urine: NEGATIVE
Hgb urine dipstick: NEGATIVE
Ketones, ur: NEGATIVE
Nitrite: NEGATIVE
RBC / HPF: NONE SEEN (ref 0–?)
SPECIFIC GRAVITY, URINE: 1.015 (ref 1.000–1.030)
Total Protein, Urine: NEGATIVE
URINE GLUCOSE: NEGATIVE
Urobilinogen, UA: 0.2 (ref 0.0–1.0)
pH: 6 (ref 5.0–8.0)

## 2013-10-05 LAB — HEMOGLOBIN A1C: HEMOGLOBIN A1C: 5.8 % (ref 4.6–6.5)

## 2013-10-05 LAB — CBC WITH DIFFERENTIAL/PLATELET
BASOS PCT: 0.8 % (ref 0.0–3.0)
Basophils Absolute: 0.1 10*3/uL (ref 0.0–0.1)
EOS PCT: 3.9 % (ref 0.0–5.0)
Eosinophils Absolute: 0.2 10*3/uL (ref 0.0–0.7)
HEMATOCRIT: 39.3 % (ref 36.0–46.0)
Hemoglobin: 13.2 g/dL (ref 12.0–15.0)
Lymphocytes Relative: 24.6 % (ref 12.0–46.0)
Lymphs Abs: 1.6 10*3/uL (ref 0.7–4.0)
MCHC: 33.5 g/dL (ref 30.0–36.0)
MCV: 90.2 fl (ref 78.0–100.0)
MONO ABS: 0.5 10*3/uL (ref 0.1–1.0)
MONOS PCT: 7.1 % (ref 3.0–12.0)
NEUTROS PCT: 63.6 % (ref 43.0–77.0)
Neutro Abs: 4.1 10*3/uL (ref 1.4–7.7)
PLATELETS: 204 10*3/uL (ref 150.0–400.0)
RBC: 4.36 Mil/uL (ref 3.87–5.11)
RDW: 14 % (ref 11.5–14.6)
WBC: 6.4 10*3/uL (ref 4.5–10.5)

## 2013-10-05 LAB — HEPATIC FUNCTION PANEL
ALBUMIN: 3.9 g/dL (ref 3.5–5.2)
ALT: 19 U/L (ref 0–35)
AST: 21 U/L (ref 0–37)
Alkaline Phosphatase: 52 U/L (ref 39–117)
Bilirubin, Direct: 0 mg/dL (ref 0.0–0.3)
Total Bilirubin: 0.5 mg/dL (ref 0.3–1.2)
Total Protein: 7.7 g/dL (ref 6.0–8.3)

## 2013-10-05 LAB — BASIC METABOLIC PANEL
BUN: 44 mg/dL — ABNORMAL HIGH (ref 6–23)
CHLORIDE: 107 meq/L (ref 96–112)
CO2: 23 mEq/L (ref 19–32)
Calcium: 10.3 mg/dL (ref 8.4–10.5)
Creatinine, Ser: 1.8 mg/dL — ABNORMAL HIGH (ref 0.4–1.2)
GFR: 29.27 mL/min — AB (ref >60.00)
Glucose, Bld: 113 mg/dL — ABNORMAL HIGH (ref 70–99)
POTASSIUM: 4 meq/L (ref 3.5–5.1)
SODIUM: 141 meq/L (ref 135–145)

## 2013-10-05 LAB — LIPID PANEL
CHOL/HDL RATIO: 2
CHOLESTEROL: 137 mg/dL (ref 0–200)
HDL: 60.2 mg/dL (ref >39.00)
LDL CALC: 60 mg/dL (ref 0–99)
Triglycerides: 82 mg/dL (ref 0.0–149.0)
VLDL: 16.4 mg/dL (ref 0.0–40.0)

## 2013-10-05 LAB — TSH: TSH: 0.83 u[IU]/mL (ref 0.35–5.50)

## 2013-10-05 MED ORDER — SIMVASTATIN 40 MG PO TABS: 40.0000 mg | ORAL_TABLET | Freq: Every day | ORAL | Status: DC

## 2013-10-05 MED ORDER — LISINOPRIL-HYDROCHLOROTHIAZIDE 20-12.5 MG PO TABS: 2.0000 | ORAL_TABLET | Freq: Every day | ORAL | Status: DC

## 2013-10-05 NOTE — Patient Instructions (Signed)
please consider these measures for your health:  minimize alcohol.  do not use tobacco products.  have a colonoscopy at least every 10 years from age 72.  Women should have an annual mammogram from age 66.  keep firearms safely stored.  always use seat belts.  have working smoke alarms in your home.  see an eye doctor and dentist regularly.  never drive under the influence of alcohol or drugs (including prescription drugs).  those with fair skin should take precautions against the sun.   please let me know what your wishes would be, if artificial life support measures should become necessary.  it is critically important to prevent falling down (keep floor areas well-lit, dry, and free of loose objects.  If you have a cane, walker, or wheelchair, you should use it, even for short trips around the house.  Also, try not to rush).   blood tests are being requested for you today.  We'll contact you with results.   Please return in 1 year.   Please let me know if you decide to do the colonoscopy.  This reduces your risk of dying of cancer.

## 2013-10-05 NOTE — Progress Notes (Signed)
   Subjective:    Patient ID: Amber Hampton, female    DOB: 1940-11-13, 73 y.o.   MRN: [id number]  HPI    Review of Systems  Constitutional: Negative for fever and unexpected weight change.  HENT: Negative for hearing loss.   Eyes: Negative for visual disturbance.  Respiratory: Negative for shortness of breath.   Cardiovascular: Negative for chest pain.  Gastrointestinal: Negative for anal bleeding.  Endocrine: Negative for cold intolerance.  Genitourinary: Negative for hematuria.  Musculoskeletal: Negative for back pain.  Skin: Negative for rash.  Allergic/Immunologic: Negative for environmental allergies.  Neurological: Negative for syncope and numbness.  Hematological: Does not bruise/bleed easily.  Psychiatric/Behavioral: Negative for dysphoric mood.       Objective:   Physical Exam VS: see vs page GEN: no distress HEAD: head: no deformity eyes: no periorbital swelling, no proptosis external nose and ears are normal mouth: no lesion seen NECK: supple, thyroid is not enlarged CHEST WALL: no deformity LUNGS:  Clear to auscultation BREASTS: sees gyn.   CV: reg rate and rhythm, no murmur ABD: abdomen is soft, nontender.  no hepatosplenomegaly.  not distended.  no hernia GENITALIA/RECTAL: sees gyn MUSCULOSKELETAL: muscle bulk and strength are grossly normal.  no obvious joint swelling.  gait is normal and steady EXTEMITIES: no deformity.  no ulcer on the feet.  feet are of normal color and temp.  no edema PULSES: dorsalis pedis intact bilat.  no carotid bruit NEURO:  cn 2-12 grossly intact.   readily moves all 4's.  sensation is intact to touch on the feet SKIN:  Normal texture and temperature.  No rash or suspicious lesion is visible.   NODES:  None palpable at the neck PSYCH: alert, well-oriented.  Does not appear anxious nor depressed.         Assessment & Plan:  Pt is here for regular wellness examination, and is feeling pretty well in general, and says chronic  med probs are stable, except as noted below we discussed code status.  pt requests full code, but would not want to be started or maintained on artificial life-support measures if there was not a reasonable chance of recovery

## 2013-11-06 ENCOUNTER — Telehealth: Payer: Self-pay

## 2013-11-06 DIAGNOSIS — R229 Localized swelling, mass and lump, unspecified: Secondary | ICD-10-CM | POA: Insufficient documentation

## 2013-11-06 NOTE — Telephone Encounter (Signed)
Referral sent If there is a long wait, please call back, so i can ask dr plotnikov to check this.

## 2013-11-06 NOTE — Telephone Encounter (Signed)
Called Amber Hampton and she states that she would like the referral to be placed With Dr. Lovey Newcomer Rankin. Please advise, Thanks!

## 2013-11-06 NOTE — Telephone Encounter (Signed)
Pt stated that she has a mole that has changed in color on her shoulder and wanted to get it checked out.

## 2013-11-06 NOTE — Telephone Encounter (Signed)
Ok, but i need to know what reason to type in

## 2013-11-06 NOTE — Telephone Encounter (Signed)
The patient called and is hoping to get a referral to a dermatologist.   Callback - [phone]

## 2013-11-08 NOTE — Telephone Encounter (Signed)
Pt informed and states that she has an appointment on 11/15/2013.

## 2014-06-26 ENCOUNTER — Other Ambulatory Visit: Payer: Self-pay

## 2014-06-26 DIAGNOSIS — Z1231 Encounter for screening mammogram for malignant neoplasm of breast: Secondary | ICD-10-CM

## 2014-07-23 ENCOUNTER — Ambulatory Visit
Admission: RE | Admit: 2014-07-23 | Discharge: 2014-07-23 | Disposition: A | Discharge status: Home / Self Care | Payer: Commercial Managed Care - HMO | Source: Ambulatory Visit

## 2014-07-23 DIAGNOSIS — Z1231 Encounter for screening mammogram for malignant neoplasm of breast: Secondary | ICD-10-CM

## 2014-12-04 ENCOUNTER — Other Ambulatory Visit: Payer: Self-pay | Admitting: Endocrinology

## 2014-12-04 NOTE — Telephone Encounter (Signed)
Please refill x 1 cpx is due 

## 2014-12-04 NOTE — Telephone Encounter (Signed)
Please advise if ok to refill Rx's. Last office visit was 10/05/2013. Thanks!

## 2014-12-05 NOTE — Telephone Encounter (Signed)
Rx sent 

## 2015-03-11 ENCOUNTER — Other Ambulatory Visit: Payer: Self-pay

## 2015-03-14 ENCOUNTER — Other Ambulatory Visit: Payer: Self-pay | Admitting: Endocrinology

## 2015-03-22 ENCOUNTER — Ambulatory Visit (INDEPENDENT_AMBULATORY_CARE_PROVIDER_SITE_OTHER): Payer: Commercial Managed Care - HMO | Admitting: Endocrinology

## 2015-03-22 ENCOUNTER — Encounter: Payer: Self-pay | Admitting: Endocrinology

## 2015-03-22 VITALS — BP 130/82 | HR 72 | Temp 97.6°F | Resp 16 | Ht 63.0 in | Wt 184.0 lb

## 2015-03-22 DIAGNOSIS — N3001 Acute cystitis with hematuria: Secondary | ICD-10-CM

## 2015-03-22 DIAGNOSIS — N390 Urinary tract infection, site not specified: Secondary | ICD-10-CM | POA: Insufficient documentation

## 2015-03-22 DIAGNOSIS — Z0000 Encounter for general adult medical examination without abnormal findings: Secondary | ICD-10-CM | POA: Diagnosis not present

## 2015-03-22 DIAGNOSIS — Z0189 Encounter for other specified special examinations: Secondary | ICD-10-CM

## 2015-03-22 DIAGNOSIS — Z23 Encounter for immunization: Secondary | ICD-10-CM

## 2015-03-22 DIAGNOSIS — R739 Hyperglycemia, unspecified: Secondary | ICD-10-CM | POA: Diagnosis not present

## 2015-03-22 LAB — URINALYSIS, ROUTINE W REFLEX MICROSCOPIC
BILIRUBIN URINE: NEGATIVE
Hgb urine dipstick: NEGATIVE
KETONES UR: NEGATIVE
Nitrite: NEGATIVE
PH: 5.5 (ref 5.0–8.0)
Specific Gravity, Urine: 1.02 (ref 1.000–1.030)
TOTAL PROTEIN, URINE-UPE24: NEGATIVE
Urine Glucose: NEGATIVE
Urobilinogen, UA: 0.2 (ref 0.0–1.0)

## 2015-03-22 LAB — CBC WITH DIFFERENTIAL/PLATELET
Basophils Absolute: 0 10*3/uL (ref 0.0–0.1)
Basophils Relative: 0.4 % (ref 0.0–3.0)
Eosinophils Absolute: 0.3 10*3/uL (ref 0.0–0.7)
Eosinophils Relative: 3.4 % (ref 0.0–5.0)
HCT: 41.9 % (ref 36.0–46.0)
Hemoglobin: 13.8 g/dL (ref 12.0–15.0)
Lymphocytes Relative: 20.9 % (ref 12.0–46.0)
Lymphs Abs: 1.6 10*3/uL (ref 0.7–4.0)
MCHC: 32.8 g/dL (ref 30.0–36.0)
MCV: 91.7 fl (ref 78.0–100.0)
MONO ABS: 0.5 10*3/uL (ref 0.1–1.0)
MONOS PCT: 6.9 % (ref 3.0–12.0)
NEUTROS PCT: 68.4 % (ref 43.0–77.0)
Neutro Abs: 5.1 10*3/uL (ref 1.4–7.7)
PLATELETS: 205 10*3/uL (ref 150.0–400.0)
RBC: 4.57 Mil/uL (ref 3.87–5.11)
RDW: 14.5 % (ref 11.5–15.5)
WBC: 7.5 10*3/uL (ref 4.0–10.5)

## 2015-03-22 LAB — HEPATIC FUNCTION PANEL
ALBUMIN: 4.1 g/dL (ref 3.5–5.2)
ALT: 13 U/L (ref 0–35)
AST: 18 U/L (ref 0–37)
Alkaline Phosphatase: 46 U/L (ref 39–117)
BILIRUBIN TOTAL: 0.4 mg/dL (ref 0.2–1.2)
Bilirubin, Direct: 0.1 mg/dL (ref 0.0–0.3)
Total Protein: 7.3 g/dL (ref 6.0–8.3)

## 2015-03-22 LAB — BASIC METABOLIC PANEL
BUN: 45 mg/dL — ABNORMAL HIGH (ref 6–23)
CO2: 26 mEq/L (ref 19–32)
Calcium: 10 mg/dL (ref 8.4–10.5)
Chloride: 105 mEq/L (ref 96–112)
Creatinine, Ser: 1.72 mg/dL — ABNORMAL HIGH (ref 0.40–1.20)
GFR: 30.92 mL/min — ABNORMAL LOW (ref >60.00)
Glucose, Bld: 77 mg/dL (ref 70–99)
POTASSIUM: 4.6 meq/L (ref 3.5–5.1)
SODIUM: 139 meq/L (ref 135–145)

## 2015-03-22 LAB — LIPID PANEL
CHOLESTEROL: 146 mg/dL (ref 0–200)
HDL: 63.7 mg/dL (ref >39.00)
LDL Cholesterol: 56 mg/dL (ref 0–99)
NonHDL: 82.3
Total CHOL/HDL Ratio: 2
Triglycerides: 130 mg/dL (ref 0.0–149.0)
VLDL: 26 mg/dL (ref 0.0–40.0)

## 2015-03-22 LAB — TSH: TSH: 1.18 u[IU]/mL (ref 0.35–4.50)

## 2015-03-22 LAB — HEMOGLOBIN A1C: Hgb A1c MFr Bld: 5.7 % (ref 4.6–6.5)

## 2015-03-22 MED ORDER — SIMVASTATIN 40 MG PO TABS: ORAL_TABLET | ORAL | Status: DC

## 2015-03-22 MED ORDER — LISINOPRIL-HYDROCHLOROTHIAZIDE 20-12.5 MG PO TABS: ORAL_TABLET | ORAL | Status: DC

## 2015-03-22 MED ORDER — CIPROFLOXACIN HCL 250 MG PO TABS: 250.0000 mg | ORAL_TABLET | Freq: Two times a day (BID) | ORAL | Status: DC

## 2015-03-22 NOTE — Progress Notes (Signed)
we discussed code status.  pt requests full code, but would not want to be started or maintained on artificial life-support measures if there was not a reasonable chance of recovery 

## 2015-03-22 NOTE — Progress Notes (Signed)
Subjective:    Patient ID: Amber Hampton, female    DOB: 11-08-1940, 74 y.o.   MRN: [id number]  HPI Pt is here for regular wellness examination, and is feeling pretty well in general, and says chronic med probs are stable, except as noted below Past Medical History  Diagnosis Date  . BREAST CANCER, HX OF 06/18/2007  . HYPERLIPIDEMIA 06/18/2007  . HYPERTENSION 06/18/2007  . ARTHRALGIA 01/12/2008  . HYPERGLYCEMIA 06/18/2007  . ASYMPTOMATIC POSTMENOPAUSAL STATUS 01/19/2008  . TOBACCO USE, QUIT 01/30/2010    Past Surgical History  Procedure Laterality Date  . Aneurysm right side head      History   Social History  . Marital Status: Married    Spouse Name: N/A  . Number of Children: N/A  . Years of Education: N/A   Occupational History  . Receptionist     Works at [employer]   Social History Main Topics  . Smoking status: Former Smoker    Quit date: 09/14/2006  . Smokeless tobacco: Not on file  . Alcohol Use: Not on file  . Drug Use: Not on file  . Sexual Activity: Not on file   Other Topics Concern  . Not on file   Social History Narrative    Current Outpatient Prescriptions on File Prior to Visit  Medication Sig Dispense Refill  . calcium carbonate (OS-CAL) 600 MG TABS Take 600 mg by mouth daily.      Marland Kitchen lisinopril-hydrochlorothiazide (PRINZIDE,ZESTORETIC) 20-12.5 MG per tablet TAKE 2 TABLETS DAILY. *APPOINTMENT NEEDED FOR FURTHER REFILLS* 180 tablet 0  . simvastatin (ZOCOR) 40 MG tablet TAKE 1 TABLET AT BEDTIME *APPOINTMENT NEEDED FOR FURTHER REFILLS* 90 tablet 0  . aspirin 81 MG tablet Take 81 mg by mouth daily.      . Omega-3 Fatty Acids (FISH OIL CONCENTRATE) 300 MG CAPS Take 1 capsule by mouth daily.       No current facility-administered medications on file prior to visit.    No Known Allergies  Family History  Problem Relation Age of Onset  . Cancer Neg Hx     No FH except for pt    BP 130/82 mmHg  Pulse 72  Temp(Src) 97.6 F (36.4 C)  (Oral)  Resp 16  Ht 5\' 3"  (1.6 m)  Wt 184 lb (83.462 kg)  BMI 32.60 kg/m2  SpO2 97%   Review of Systems  Constitutional: Negative for unexpected weight change.  HENT: Negative for hearing loss.   Eyes: Negative for visual disturbance.  Respiratory: Negative for shortness of breath.   Cardiovascular: Negative for chest pain.  Gastrointestinal: Negative for anal bleeding.  Endocrine: Negative for cold intolerance.  Musculoskeletal: Negative for back pain.  Skin: Negative for rash.  Allergic/Immunologic: Negative for environmental allergies.  Neurological: Negative for syncope and headaches.  Hematological: Does not bruise/bleed easily.  Psychiatric/Behavioral: Negative for dysphoric mood.       Objective:   Physical Exam VS: see vs page GEN: no distress HEAD: head: no deformity eyes: no periorbital swelling, no proptosis external nose and ears are normal mouth: no lesion seen NECK: supple, thyroid is not enlarged CHEST WALL: no deformity LUNGS:  Clear to auscultation BREASTS: declined CV: reg rate and rhythm, no murmur GENITALIA/RECTAL: declined MUSCULOSKELETAL: muscle bulk and strength are grossly normal.  no obvious joint swelling.  gait is normal and steady EXTEMITIES: no deformity.  no ulcer on the feet.  feet are of normal color and temp.  no edema.  There is bilateral onychomycosis  of the toenails.  PULSES: dorsalis pedis intact bilat.  no carotid bruit NEURO:  cn 2-12 grossly intact.   readily moves all 4's.  sensation is intact to touch on the feet SKIN:  Normal texture and temperature.  No rash or suspicious lesion is visible.   NODES:  None palpable at the neck PSYCH: alert, well-oriented.  Does not appear anxious nor depressed.   ECG is declined    Assessment & Plan:  Wellness visit today, with problems stable, except as noted.  Patient is advised the following: Patient Instructions  please consider these measures for your health:  minimize alcohol.  do  not use tobacco products.  have a colonoscopy at least every 10 years from age 57.  Women should have an annual mammogram from age 68.  keep firearms safely stored.  always use seat belts.  have working smoke alarms in your home.  see an eye doctor and dentist regularly.  never drive under the influence of alcohol or drugs (including prescription drugs).  those with fair skin should take precautions against the sun. it is critically important to prevent falling down (keep floor areas well-lit, dry, and free of loose objects.  If you have a cane, walker, or wheelchair, you should use it, even for short trips around the house.  Also, try not to rush) blood tests are requested for you today.  We'll let you know about the results. Please return in 1 year.    SEPARATE EVALUATION FOLLOWS--EACH PROBLEM HERE IS NEW, NOT RESPONDING TO TREATMENT, OR POSES SIGNIFICANT RISK TO THE PATIENT'S HEALTH: HISTORY OF THE PRESENT ILLNESS: Pt is noted on today's labs to have UTI.  She denies hematuria PAST MEDICAL HISTORY reviewed and up to date today REVIEW OF SYSTEMS: Denies fever PHYSICAL EXAMINATION: VITAL SIGNS:  See vs page GENERAL: no distress ABDOMEN: abdomen is soft, nontender.  no hepatosplenomegaly.  not distended.  no hernia LAB/XRAY RESULTS: UA pos for UTI IMPRESSION: UTI, new PLAN:  i have sent a prescription to your pharmacy, for an antibiotic pill

## 2015-03-22 NOTE — Patient Instructions (Signed)
please consider these measures for your health:  minimize alcohol.  do not use tobacco products.  have a colonoscopy at least every 10 years from age 73.  Women should have an annual mammogram from age 51.  keep firearms safely stored.  always use seat belts.  have working smoke alarms in your home.  see an eye doctor and dentist regularly.  never drive under the influence of alcohol or drugs (including prescription drugs).  those with fair skin should take precautions against the sun. it is critically important to prevent falling down (keep floor areas well-lit, dry, and free of loose objects.  If you have a cane, walker, or wheelchair, you should use it, even for short trips around the house.  Also, try not to rush) blood tests are requested for you today.  We'll let you know about the results. Please return in 1 year.

## 2015-03-27 ENCOUNTER — Telehealth: Payer: Self-pay | Admitting: Endocrinology

## 2015-03-27 NOTE — Telephone Encounter (Signed)
Please call pt back she wants to know why she is taking CIPRO call [phone]

## 2015-03-27 NOTE — Telephone Encounter (Signed)
Pt is taking CIPRO as an antibiotic medication

## 2015-03-28 ENCOUNTER — Telehealth: Payer: Self-pay | Admitting: Endocrinology

## 2015-03-28 NOTE — Telephone Encounter (Signed)
Patient called and wanted to know why she is prescribed cipro  Advised that she currently has a UTI to take as directed and if any further assistance please call the office Amber Hampton is aware and will do so

## 2015-03-28 NOTE — Telephone Encounter (Signed)
[facility] note dated 03/28/15 at 7:47 AM: Caller states she received a rx during her 03/22/15 appt for an antibiotic and isnt sure why. Please advise

## 2015-03-28 NOTE — Telephone Encounter (Signed)
Patient called and wanted to know why she is prescribed cipro  Advised that she currently has a UTI to take as directed and if any further assistance please call the office Amber Hampton is aware and will do so   Thank you

## 2015-06-18 ENCOUNTER — Other Ambulatory Visit: Payer: Self-pay

## 2015-06-18 DIAGNOSIS — Z1231 Encounter for screening mammogram for malignant neoplasm of breast: Secondary | ICD-10-CM

## 2015-07-26 ENCOUNTER — Ambulatory Visit
Admission: RE | Admit: 2015-07-26 | Discharge: 2015-07-26 | Disposition: A | Discharge status: Home / Self Care | Payer: Commercial Managed Care - HMO | Source: Ambulatory Visit

## 2015-07-26 DIAGNOSIS — Z1231 Encounter for screening mammogram for malignant neoplasm of breast: Secondary | ICD-10-CM

## 2015-11-28 ENCOUNTER — Inpatient Hospital Stay (HOSPITAL_COMMUNITY)
Admission: EM | Admit: 2015-11-28 | Discharge: 2015-11-30 | DRG: 154 | Disposition: A | Discharge status: Home / Self Care | Payer: Commercial Managed Care - HMO | Attending: Internal Medicine | Admitting: Internal Medicine

## 2015-11-28 ENCOUNTER — Inpatient Hospital Stay (HOSPITAL_COMMUNITY): Payer: Commercial Managed Care - HMO

## 2015-11-28 ENCOUNTER — Emergency Department (HOSPITAL_COMMUNITY): Payer: Commercial Managed Care - HMO

## 2015-11-28 ENCOUNTER — Encounter (HOSPITAL_COMMUNITY): Payer: Self-pay | Admitting: *Deleted

## 2015-11-28 DIAGNOSIS — Y92239 Unspecified place in hospital as the place of occurrence of the external cause: Secondary | ICD-10-CM | POA: Diagnosis present

## 2015-11-28 DIAGNOSIS — Z7982 Long term (current) use of aspirin: Secondary | ICD-10-CM | POA: Diagnosis not present

## 2015-11-28 DIAGNOSIS — I129 Hypertensive chronic kidney disease with stage 1 through stage 4 chronic kidney disease, or unspecified chronic kidney disease: Secondary | ICD-10-CM | POA: Diagnosis not present

## 2015-11-28 DIAGNOSIS — D72829 Elevated white blood cell count, unspecified: Secondary | ICD-10-CM

## 2015-11-28 DIAGNOSIS — E876 Hypokalemia: Secondary | ICD-10-CM | POA: Diagnosis not present

## 2015-11-28 DIAGNOSIS — H5509 Other forms of nystagmus: Secondary | ICD-10-CM | POA: Diagnosis not present

## 2015-11-28 DIAGNOSIS — R112 Nausea with vomiting, unspecified: Secondary | ICD-10-CM | POA: Diagnosis not present

## 2015-11-28 DIAGNOSIS — Z87891 Personal history of nicotine dependence: Secondary | ICD-10-CM | POA: Diagnosis not present

## 2015-11-28 DIAGNOSIS — T380X5A Adverse effect of glucocorticoids and synthetic analogues, initial encounter: Secondary | ICD-10-CM | POA: Diagnosis present

## 2015-11-28 DIAGNOSIS — H8112 Benign paroxysmal vertigo, left ear: Secondary | ICD-10-CM | POA: Diagnosis not present

## 2015-11-28 DIAGNOSIS — R110 Nausea: Secondary | ICD-10-CM | POA: Diagnosis not present

## 2015-11-28 DIAGNOSIS — R404 Transient alteration of awareness: Secondary | ICD-10-CM | POA: Diagnosis not present

## 2015-11-28 DIAGNOSIS — N289 Disorder of kidney and ureter, unspecified: Secondary | ICD-10-CM

## 2015-11-28 DIAGNOSIS — H8110 Benign paroxysmal vertigo, unspecified ear: Secondary | ICD-10-CM | POA: Diagnosis present

## 2015-11-28 DIAGNOSIS — Z853 Personal history of malignant neoplasm of breast: Secondary | ICD-10-CM | POA: Diagnosis not present

## 2015-11-28 DIAGNOSIS — E785 Hyperlipidemia, unspecified: Secondary | ICD-10-CM | POA: Diagnosis present

## 2015-11-28 DIAGNOSIS — N170 Acute kidney failure with tubular necrosis: Secondary | ICD-10-CM | POA: Diagnosis present

## 2015-11-28 DIAGNOSIS — N19 Unspecified kidney failure: Secondary | ICD-10-CM | POA: Diagnosis not present

## 2015-11-28 DIAGNOSIS — R1110 Vomiting, unspecified: Secondary | ICD-10-CM

## 2015-11-28 DIAGNOSIS — I10 Essential (primary) hypertension: Secondary | ICD-10-CM | POA: Diagnosis not present

## 2015-11-28 DIAGNOSIS — R51 Headache: Secondary | ICD-10-CM | POA: Diagnosis not present

## 2015-11-28 DIAGNOSIS — R270 Ataxia, unspecified: Secondary | ICD-10-CM | POA: Insufficient documentation

## 2015-11-28 DIAGNOSIS — H6122 Impacted cerumen, left ear: Secondary | ICD-10-CM | POA: Diagnosis present

## 2015-11-28 DIAGNOSIS — N183 Chronic kidney disease, stage 3 (moderate): Secondary | ICD-10-CM | POA: Diagnosis present

## 2015-11-28 DIAGNOSIS — I609 Nontraumatic subarachnoid hemorrhage, unspecified: Secondary | ICD-10-CM | POA: Diagnosis not present

## 2015-11-28 DIAGNOSIS — H8113 Benign paroxysmal vertigo, bilateral: Secondary | ICD-10-CM | POA: Diagnosis not present

## 2015-11-28 DIAGNOSIS — R739 Hyperglycemia, unspecified: Secondary | ICD-10-CM | POA: Diagnosis not present

## 2015-11-28 DIAGNOSIS — I729 Aneurysm of unspecified site: Secondary | ICD-10-CM | POA: Insufficient documentation

## 2015-11-28 DIAGNOSIS — H933X9 Disorders of unspecified acoustic nerve: Secondary | ICD-10-CM | POA: Diagnosis not present

## 2015-11-28 DIAGNOSIS — R42 Dizziness and giddiness: Secondary | ICD-10-CM | POA: Diagnosis not present

## 2015-11-28 DIAGNOSIS — H8111 Benign paroxysmal vertigo, right ear: Secondary | ICD-10-CM | POA: Diagnosis not present

## 2015-11-28 HISTORY — DX: Malignant (primary) neoplasm, unspecified: C80.1

## 2015-11-28 LAB — URINALYSIS, ROUTINE W REFLEX MICROSCOPIC
Bilirubin Urine: NEGATIVE
GLUCOSE, UA: NEGATIVE mg/dL
Hgb urine dipstick: NEGATIVE
KETONES UR: NEGATIVE mg/dL
LEUKOCYTES UA: NEGATIVE
Nitrite: NEGATIVE
PROTEIN: NEGATIVE mg/dL
Specific Gravity, Urine: 1.016 (ref 1.005–1.030)
pH: 7 (ref 5.0–8.0)

## 2015-11-28 LAB — CBC WITH DIFFERENTIAL/PLATELET
BASOS ABS: 0 10*3/uL (ref 0.0–0.1)
BASOS PCT: 0 %
EOS PCT: 0 %
Eosinophils Absolute: 0 10*3/uL (ref 0.0–0.7)
HCT: 41.9 % (ref 36.0–46.0)
Hemoglobin: 13.3 g/dL (ref 12.0–15.0)
Lymphocytes Relative: 10 %
Lymphs Abs: 0.8 10*3/uL (ref 0.7–4.0)
MCH: 29.6 pg (ref 26.0–34.0)
MCHC: 31.7 g/dL (ref 30.0–36.0)
MCV: 93.3 fL (ref 78.0–100.0)
MONO ABS: 0 10*3/uL — AB (ref 0.1–1.0)
Monocytes Relative: 0 %
Neutro Abs: 7.6 10*3/uL (ref 1.7–7.7)
Neutrophils Relative %: 90 %
PLATELETS: 206 10*3/uL (ref 150–400)
RBC: 4.49 MIL/uL (ref 3.87–5.11)
RDW: 14.2 % (ref 11.5–15.5)
WBC: 8.4 10*3/uL (ref 4.0–10.5)

## 2015-11-28 LAB — COMPREHENSIVE METABOLIC PANEL
ALBUMIN: 3.7 g/dL (ref 3.5–5.0)
ALT: 14 U/L (ref 14–54)
AST: 20 U/L (ref 15–41)
Alkaline Phosphatase: 45 U/L (ref 38–126)
Anion gap: 13 (ref 5–15)
BUN: 34 mg/dL — AB (ref 6–20)
CHLORIDE: 107 mmol/L (ref 101–111)
CO2: 21 mmol/L — AB (ref 22–32)
CREATININE: 2.07 mg/dL — AB (ref 0.44–1.00)
Calcium: 9.4 mg/dL (ref 8.9–10.3)
GFR calc Af Amer: 26 mL/min — ABNORMAL LOW (ref >60)
GFR, EST NON AFRICAN AMERICAN: 23 mL/min — AB (ref >60)
Glucose, Bld: 147 mg/dL — ABNORMAL HIGH (ref 65–99)
POTASSIUM: 4.3 mmol/L (ref 3.5–5.1)
SODIUM: 141 mmol/L (ref 135–145)
Total Bilirubin: 0.4 mg/dL (ref 0.3–1.2)
Total Protein: 6.8 g/dL (ref 6.5–8.1)

## 2015-11-28 LAB — CBC
HCT: 40.4 % (ref 36.0–46.0)
Hemoglobin: 13.3 g/dL (ref 12.0–15.0)
MCH: 30.4 pg (ref 26.0–34.0)
MCHC: 32.9 g/dL (ref 30.0–36.0)
MCV: 92.4 fL (ref 78.0–100.0)
PLATELETS: 199 10*3/uL (ref 150–400)
RBC: 4.37 MIL/uL (ref 3.87–5.11)
RDW: 14 % (ref 11.5–15.5)
WBC: 13.2 10*3/uL — ABNORMAL HIGH (ref 4.0–10.5)

## 2015-11-28 LAB — RENAL FUNCTION PANEL
ALBUMIN: 3.6 g/dL (ref 3.5–5.0)
Anion gap: 12 (ref 5–15)
BUN: 26 mg/dL — AB (ref 6–20)
CALCIUM: 9.3 mg/dL (ref 8.9–10.3)
CO2: 21 mmol/L — ABNORMAL LOW (ref 22–32)
CREATININE: 1.6 mg/dL — AB (ref 0.44–1.00)
Chloride: 107 mmol/L (ref 101–111)
GFR calc Af Amer: 36 mL/min — ABNORMAL LOW (ref >60)
GFR, EST NON AFRICAN AMERICAN: 31 mL/min — AB (ref >60)
Glucose, Bld: 158 mg/dL — ABNORMAL HIGH (ref 65–99)
PHOSPHORUS: 2.3 mg/dL — AB (ref 2.5–4.6)
Potassium: 3.8 mmol/L (ref 3.5–5.1)
SODIUM: 140 mmol/L (ref 135–145)

## 2015-11-28 MED ORDER — INSULIN ASPART 100 UNIT/ML ~~LOC~~ SOLN
0.0000 [IU] | Freq: Three times a day (TID) | SUBCUTANEOUS | Status: DC
  Administered 2015-11-29 (×3): 2 [IU] via SUBCUTANEOUS
  Administered 2015-11-30: 1 [IU] via SUBCUTANEOUS

## 2015-11-28 MED ORDER — ACETAMINOPHEN 325 MG PO TABS
650.0000 mg | ORAL_TABLET | Freq: Four times a day (QID) | ORAL | Status: DC | PRN
  Administered 2015-11-29: 650 mg via ORAL
  Filled 2015-11-28: qty 2

## 2015-11-28 MED ORDER — SODIUM CHLORIDE 0.9 % IV SOLN
INTRAVENOUS | Status: DC
  Administered 2015-11-28 – 2015-11-30 (×3): via INTRAVENOUS

## 2015-11-28 MED ORDER — ONDANSETRON HCL 4 MG/2ML IJ SOLN: 4.0000 mg | Freq: Four times a day (QID) | INTRAMUSCULAR | Status: DC | PRN

## 2015-11-28 MED ORDER — FAMOTIDINE IN NACL 20-0.9 MG/50ML-% IV SOLN
20.0000 mg | Freq: Two times a day (BID) | INTRAVENOUS | Status: DC
  Administered 2015-11-28 (×2): 20 mg via INTRAVENOUS
  Filled 2015-11-28 (×4): qty 50

## 2015-11-28 MED ORDER — MECLIZINE HCL 25 MG PO TABS
25.0000 mg | ORAL_TABLET | Freq: Once | ORAL | Status: AC
  Administered 2015-11-28: 25 mg via ORAL
  Filled 2015-11-28: qty 1

## 2015-11-28 MED ORDER — AMLODIPINE BESYLATE 5 MG PO TABS
5.0000 mg | ORAL_TABLET | Freq: Every day | ORAL | Status: DC
Start: 2015-11-28 — End: 2015-11-30
  Administered 2015-11-28 – 2015-11-30 (×3): 5 mg via ORAL
  Filled 2015-11-28 (×3): qty 1

## 2015-11-28 MED ORDER — ONDANSETRON HCL 4 MG PO TABS: 4.0000 mg | ORAL_TABLET | Freq: Four times a day (QID) | ORAL | Status: DC | PRN

## 2015-11-28 MED ORDER — SODIUM CHLORIDE 0.9 % IV SOLN
250.0000 mg | Freq: Four times a day (QID) | INTRAVENOUS | Status: DC
  Administered 2015-11-28 – 2015-11-30 (×8): 250 mg via INTRAVENOUS
  Filled 2015-11-28 (×13): qty 2

## 2015-11-28 MED ORDER — ONDANSETRON HCL 4 MG/2ML IJ SOLN
4.0000 mg | Freq: Once | INTRAMUSCULAR | Status: AC
  Administered 2015-11-28: 4 mg via INTRAVENOUS
  Filled 2015-11-28: qty 2

## 2015-11-28 MED ORDER — DIAZEPAM 5 MG/ML IJ SOLN
10.0000 mg | Freq: Once | INTRAMUSCULAR | Status: AC
  Administered 2015-11-28: 10 mg via INTRAVENOUS
  Filled 2015-11-28: qty 2

## 2015-11-28 MED ORDER — ACETAMINOPHEN 650 MG RE SUPP: 650.0000 mg | Freq: Four times a day (QID) | RECTAL | Status: DC | PRN

## 2015-11-28 MED ORDER — SODIUM CHLORIDE 0.9 % IV BOLUS (SEPSIS)
1000.0000 mL | Freq: Once | INTRAVENOUS | Status: AC
  Administered 2015-11-28: 1000 mL via INTRAVENOUS

## 2015-11-28 MED ORDER — METOCLOPRAMIDE HCL 5 MG/ML IJ SOLN
10.0000 mg | Freq: Once | INTRAMUSCULAR | Status: AC
  Administered 2015-11-28: 10 mg via INTRAVENOUS
  Filled 2015-11-28: qty 2

## 2015-11-28 MED ORDER — METOCLOPRAMIDE HCL 5 MG/ML IJ SOLN: 10.0000 mg | Freq: Once | INTRAMUSCULAR | Status: DC

## 2015-11-28 MED ORDER — SIMVASTATIN 40 MG PO TABS
40.0000 mg | ORAL_TABLET | Freq: Every day | ORAL | Status: DC
  Administered 2015-11-28: 40 mg via ORAL
  Filled 2015-11-28: qty 1

## 2015-11-28 NOTE — ED Notes (Signed)
Pt unable to void for urine specimen. Will attempt again later

## 2015-11-28 NOTE — ED Notes (Signed)
Pt vomiting during triage assessment, clear yellow emesis

## 2015-11-28 NOTE — Consult Note (Signed)
Neurology Consultation Reason for Consult: vertigo Referring Physician: Dr Rex Kras  CC: Nausea vomiting and vertigo  History is obtained from:patient  HPI: Amber Hampton is a 75 y.o. female who has a hx of HTN, breast CA and a prior aneurysmal clip that unfortunately is not MRI compatible who was woken up by the dog barking this morning and when she tried to get up the world was spinning around her and she felt nauseated and had several episodes of vomiting.  She went to the bathroom but had to stay on the floor unable to get up for a while before husband was finally able to get her up.  Ultimately as she was not getting better they decided to come to the hospital for evaluation.  In the ED she has continued to be vertiginous adn to vomit.  She was given zofran x 1.  I was called emergently to evaluated her for stroke and I saw her right away in bed four.  Her neurological exam is actually completely normal except for the presence of rotational nystagmus in all directions of gaze with a consistent slow component to the left.  Denies slurred speech dysaphagia dysarthria focal numbness or weakness.  She has not been sick recently except for a problem with her right earl this entire week. She was found to have a large wax plug in that ear by the ED attending.   LKW: woke up with sx. tpa given?: no - sx not c/w stroke and hx of SAH   ROS: A 14 point ROS was performed and is negative except as noted in the HPI  Past Medical History  Diagnosis Date  . BREAST CANCER, HX OF 06/18/2007  . HYPERLIPIDEMIA 06/18/2007  . HYPERTENSION 06/18/2007  . ARTHRALGIA 01/12/2008  . HYPERGLYCEMIA 06/18/2007  . ASYMPTOMATIC POSTMENOPAUSAL STATUS 01/19/2008  . TOBACCO USE, QUIT 01/30/2010    Family History  Problem Relation Age of Onset  . Cancer Neg Hx     No FH except for pt    Social History:  reports that she quit smoking about 9 years ago. She does not have any smokeless tobacco history on file. She reports that  she does not drink alcohol or use illicit drugs.  Exam: Current vital signs: BP 130/65 mmHg  Pulse 81  Temp(Src) 97.4 F (36.3 C) (Oral)  Resp 21  Ht 5\' 4"  (1.626 m)  Wt 90.719 kg (200 lb)  BMI 34.31 kg/m2  SpO2 94% Vital signs in last 24 hours: Temp:  [97.4 F (36.3 C)] 97.4 F (36.3 C) (03/16 0211) Pulse Rate:  [66-100] 81 (03/16 0545) Resp:  [11-30] 21 (03/16 0545) BP: (114-156)/(55-75) 130/65 mmHg (03/16 0545) SpO2:  [91 %-99 %] 94 % (03/16 0545) Weight:  [90.719 kg (200 lb)] 90.719 kg (200 lb) (03/16 0211)   Physical Exam  Constitutional: Appears well-developed and well-nourished.  Psych: Affect appropriate to situation Eyes: No scleral injection HENT: No OP obstrucion Head: Normocephalic.  Cardiovascular: Normal rate and regular rhythm.  Respiratory: Effort normal and breath sounds normal to anterior ascultation GI: Soft.  No distension. There is no tenderness.  Skin: WDI  Neuro: Mental Status: Patient is awake, alert, oriented to person, place, month, year, and situation Patient is able to give a clear and coherent history. No signs of aphasia or neglect Cranial Nerves: II: Visual Fields are full. Pupils are equal, round, and reactive to light.  III,IV, VI: EOMI without ptosis or diploplia. There is ratatory nystagmus in all directions of gaze  with a consistent slow component to the left - this happens even in the absence of of dix hallpike maneuver V: Facial sensation is symmetric to temperature VII: Facial movement is symmetric.  VIII: hearing is intact to voice X: Uvula elevates symmetrically XI: Shoulder shrug is symmetric. XII: tongue is midline without atrophy or fasciculations.  Motor: Tone is normal. Bulk is normal. 5/5 strength was present in all four extremities.  Sensory: Sensation is symmetric to light touch and temperature in the arms and legs. Deep Tendon Reflexes: 2+ and symmetric in the biceps and patellae.  Plantars: Toes are downgoing  bilaterally.  Cerebellar: FNF and HKS are intact bilaterally    I have reviewed labs in epic and the results pertinent to this consultation  I have reviewed the images obtained: CT head  Impression: benign positional paroxysmal vertigo.  I have given her 10mg  of valium IV and 10 of reglan.  Will also give 25 meclizine and 25 of benadryl.  This should make her sleepy but likely will alleviate her symptoms in preparation for an Eppley maneuver.  I attempted this maneuver but pateint promptly started vomiting quickly after i sat her up and was not able to even start teh maneuver.  I will sign out the case to the day neurohospitalist team so they can come and perform an eppley unless this can be ordered from physical theprapy. If eppley is successful pt can hopefully be discharged from teh ED otherwise will require admission for PT eval.  D/w Dr Rex Kras.  Recommendations: 1) as above

## 2015-11-28 NOTE — ED Provider Notes (Signed)
CSN: [id number]     Arrival date & time 11/28/15  0204 History  By signing my name below, I, Amber Hampton, attest that this documentation has been prepared under the direction and in the presence of Amber Iles, MD Electronically Signed: Evonnie Hampton, ED Scribe 11/28/2015 at 2:55 AM.   Chief Complaint  Patient presents with  . Dizziness   The history is provided by the patient. No language interpreter was used.    HPI Comments: Amber Hampton is a 75 y.o. female with a pmhx of a brain aneurysm s/p clipping, brought in by ambulance, who presents to the Emergency Department complaining of sudden onset vertigo that occurred suddenly when she was woken up earlier tonight by her dog. Pt described her dizziness as if her "eyes were rolling" as soon as she woke up. Symptom was not initiated by any movement, and had begun while she was still in bed. Pt then had associated nausea and clear yellow emesis. Pt was unable to walk without feeling dizzy. Dizziness had subsided since the incident but has since then returned. Pt has experienced no similar sx in the past. She did not fall or hit her head.  Pt also c/o a muffled feeling in her right ear x1 week.  Pt denies any blurry vision, weakness or numbness in any extremities, new/exacerbated SOB, or CP. Pt has had no recent sicknesses.    Past Medical History  Diagnosis Date  . BREAST CANCER, HX OF 06/18/2007  . HYPERLIPIDEMIA 06/18/2007  . HYPERTENSION 06/18/2007  . ARTHRALGIA 01/12/2008  . HYPERGLYCEMIA 06/18/2007  . ASYMPTOMATIC POSTMENOPAUSAL STATUS 01/19/2008  . TOBACCO USE, QUIT 01/30/2010   Past Surgical History  Procedure Laterality Date  . Aneurysm right side head    . Breast lumpectomy     Family History  Problem Relation Age of Onset  . Cancer Neg Hx     No FH except for pt   Social History  Substance Use Topics  . Smoking status: Former Smoker    Quit date: 09/14/2006  . Smokeless tobacco: None  . Alcohol Use: No    OB History    No data available     Review of Systems  10 Systems reviewed and all are negative for acute change except as noted in the HPI.  Allergies  Review of patient's allergies indicates no known allergies.  Home Medications   Prior to Admission medications   Medication Sig Start Date End Date Taking? Authorizing Provider  aspirin 325 MG tablet Take 650 mg by mouth every 4 (four) hours as needed for mild pain.   Yes Historical Provider, MD  calcium carbonate (OS-CAL) 600 MG TABS Take 600 mg by mouth daily.     Yes Historical Provider, MD  lisinopril-hydrochlorothiazide (PRINZIDE,ZESTORETIC) 20-12.5 MG per tablet TAKE 2 TABLETS DAILY 03/22/15  Yes [name], MD  simvastatin (ZOCOR) 40 MG tablet 1 tab qhs 03/22/15  Yes [name], MD   BP 138/66 mmHg  Pulse 69  Temp(Src) 97.4 F (36.3 C) (Oral)  Resp 11  Ht 5\' 4"  (1.626 m)  Wt 200 lb (90.719 kg)  BMI 34.31 kg/m2  SpO2 99% Physical Exam  Constitutional: She is oriented to person, place, and time. She appears well-developed and well-nourished. No distress.  Awake, alert. Uncomfortable holding emesis basin.   HENT:  Head: Normocephalic and atraumatic.  Right Ear: Full with cerumen.  Left ear: normal TM  Eyes: Conjunctivae and EOM are normal. Pupils are equal, round, and reactive to  light.  Rotary nystagmus.   Neck: Neck supple.  Cardiovascular: Normal rate, regular rhythm and normal heart sounds.   No murmur heard. Pulmonary/Chest: Effort normal and breath sounds normal. No respiratory distress.  Abdominal: Soft. Bowel sounds are normal. She exhibits no distension.  Musculoskeletal: She exhibits no edema.  Neurological: She is alert and oriented to person, place, and time. She has normal reflexes. No cranial nerve deficit. She exhibits normal muscle tone.  Fluent speech, normal finger-to-nose testing, negative pronator drift. 5/5 strength and normal sensation for all 4 extremities.   Skin: Skin is warm and dry.   Psychiatric: She has a normal mood and affect. Judgment and thought content normal.  Nursing note and vitals reviewed.   ED Course  Procedures  DIAGNOSTIC STUDIES: Oxygen Saturation is 99% on RA, normal by my interpretation.    COORDINATION OF CARE: 2:38 AM-Discussed treatment plan which includes CT Head, Zofran, blood work, UA, and EKG, with pt at bedside and pt agreed to plan.   Labs Review Labs Reviewed  CBC - Abnormal; Notable for the following:    WBC 13.2 (*)    All other components within normal limits  COMPREHENSIVE METABOLIC PANEL - Abnormal; Notable for the following:    CO2 21 (*)    Glucose, Bld 147 (*)    BUN 34 (*)    Creatinine, Ser 2.07 (*)    GFR calc non Af Amer 23 (*)    GFR calc Af Amer 26 (*)    All other components within normal limits  URINALYSIS, ROUTINE W REFLEX MICROSCOPIC (NOT AT [facility])    Imaging Review Ct Head Wo Contrast  11/28/2015  CLINICAL DATA:  Acute onset dizziness and vomiting. EXAM: CT HEAD WITHOUT CONTRAST TECHNIQUE: Contiguous axial images were obtained from the base of the skull through the vertex without intravenous contrast. COMPARISON:  None. FINDINGS: Skull and Sinuses:Unremarkable right pterional craniotomy site. No acute osseous finding. No acute sinusitis or mastoiditis. Visualized orbits:  Noted gaze to the left. Brain: No evidence of acute infarction, hemorrhage, hydrocephalus, or mass lesion/mass effect. Sequela of right PCOM region aneurysm clipping. There is superficial gliosis in the right temporal pole and frontal operculum. Mild for age patchy low-density in the deep cerebral white matter attributed to chronic small vessel disease. IMPRESSION: 1. No acute finding. 2. Prior right aneurysm clipping. 3. Mild chronic small vessel disease. Electronically Signed   By: Amber Hampton M.D.   On: 11/28/2015 04:13   I have personally reviewed and evaluated these lab results as part of my medical decision-making.   EKG  Interpretation   Date/Time:  Thursday November 28 2015 02:11:09 EDT Ventricular Rate:  69 PR Interval:  150 QRS Duration: 100 QT Interval:  419 QTC Calculation: 449 R Axis:   62 Text Interpretation:  Sinus rhythm No previous ECGs available Confirmed by  Elmon Shader MD, Briellah Baik [phone]) on 11/28/2015 2:12:28 AM     Medications  ondansetron (ZOFRAN) injection 4 mg (4 mg Intravenous Given 11/28/15 0247)  sodium chloride 0.9 % bolus 1,000 mL (0 mLs Intravenous Stopped 11/28/15 0553)  meclizine (ANTIVERT) tablet 25 mg (25 mg Oral Given 11/28/15 0646)  diazepam (VALIUM) injection 10 mg (10 mg Intravenous Given 11/28/15 0647)  metoCLOPramide (REGLAN) injection 10 mg (10 mg Intravenous Given 11/28/15 0646)    MDM   Final diagnoses:  Vertigo  Intractable vomiting with nausea, vomiting of unspecified type   Patient with sudden onset of dizziness while in bed associated with vomiting. On arrival, she  was uncomfortable with active vomiting. Vital signs unremarkable. She had bilateral Rotary nystagmus on exam but the remainder of her neurologic exam was normal. Gave the patient Zofran and IV fluid bolus as well as meclizine and obtained above labs. EKG was unremarkable. She has slightly worse creatinine at 2, baseline is near 1.7. Head CT without acute findings. Patient states she is not supposed to have MRI because of previous clipping and I am unable to obtain CTA given her kidney function, therefore consulted neurology and discussed with Dr. Wendee Beavers. I appreciate his assistance. He evaluated patient and attempted to perform Epley maneuver but the patient became too symptomatic and began vomiting again. He ordered Valium, Reglan, and repeat meclizine. He will have daytime colleague reevaluate the patient and again attempt Epley maneuver. I am signing the patient out to the oncoming provider. Her disposition is pending neurology recommendations.  I personally performed the services described in this documentation,  which was scribed in my presence. The recorded information has been reviewed and is accurate.     Amber Iles, MD 11/28/15 [phone]

## 2015-11-28 NOTE — Progress Notes (Signed)
Called to get report, RN unavailable at this time

## 2015-11-28 NOTE — ED Notes (Signed)
Family made it aware that patient unable to get MRI due clip in her head. MD Pfeifer aware of family concerns.

## 2015-11-28 NOTE — H&P (Signed)
[facility] History and Physical                                                                                    Amber Hampton, is a 75 y.o. female  MRN: [id number]   DOB - Jul 09, 1941  Admit Date - 11/28/2015  Outpatient Primary MD for the patient is [name], MD  With History of -  Past Medical History  Diagnosis Date  . BREAST CANCER, HX OF 06/18/2007  . HYPERLIPIDEMIA 06/18/2007  . HYPERTENSION 06/18/2007  . ARTHRALGIA 01/12/2008  . HYPERGLYCEMIA 06/18/2007  . ASYMPTOMATIC POSTMENOPAUSAL STATUS 01/19/2008  . TOBACCO USE, QUIT 01/30/2010      Past Surgical History  Procedure Laterality Date  . Aneurysm right side head    . Breast lumpectomy      in for   Chief Complaint  Patient presents with  . Dizziness     HPI  Amber Hampton  is a 75 y.o. female with pmh of hypertension, hyperlipidemia, hyperglycemia and chronic renal insufficiency presented to ED this am with complaints of dizziness. Pt states she felt fine before going to bed last evening around 9:30. She awoke at midnight to let dog out. When she sat up in bed she experienced severe dizziness, n/v. Pt describes dizziness as "world spinning around her". She was unable to get up off of bathroom floor due to dizziness and vomiting prompting husband to call EMS.   On arrival to ED, pt's vertigo persisted. It is now much improved after receiving IV valium. A large amount of earwax was removed from right ear by EDP. She continues to have nystagmus. Neurology was initially consulted for concern of stroke. Pt unable to have MRI d/t previous aneurysm clipping. Otherwise, labs remarkable for slight elevation in WBC  Of 13.2 and Creatinine of 2.07 (baseline ~1.8). Pt denies weakness, slurred speech, facial droop,recent fever, headache, chest pain, sob, adominal pain or dysuria.  Plan at this time is to admit for further w/u of vertigo.  Review of Systems   In addition to the HPI above,  No Fever-chills, No Headache, No  changes with Vision or hearing, No problems swallowing food or Liquids, No Chest pain, Cough or Shortness of Breath, No Abdominal pain,Bowel movements are regular, No Hampton in stool or Urine, No dysuria, No new skin rashes or bruises, No new joints pains-aches,  No new weakness, tingling, numbness in any extremity, No recent weight gain or loss, A full 10 point Review of Systems was done, except as stated above, all other Review of Systems were negative.  Social History Married. Retired as Network engineer at [employer]. Quit smoking 8 years ago, no etoh. Has disabled son requiring 24 care at home.  Family History Family History  Problem Relation Age of Onset  . Cancer Neg Hx     No FH except for pt    Prior to Admission medications   Medication Sig Start Date End Date Taking? Authorizing Provider  aspirin 325 MG tablet Take 650 mg by mouth every 4 (four) hours as needed for mild pain.   Yes Historical Provider, MD  calcium carbonate (OS-CAL) 600 MG TABS Take 600 mg  by mouth daily.     Yes Historical Provider, MD  lisinopril-hydrochlorothiazide (PRINZIDE,ZESTORETIC) 20-12.5 MG per tablet TAKE 2 TABLETS DAILY 03/22/15  Yes [name], MD  simvastatin (ZOCOR) 40 MG tablet 1 tab qhs 03/22/15  Yes [name], MD    No Known Allergies  Physical Exam  Vitals  Hampton pressure 132/101, pulse 79, temperature 97.4 F (36.3 C), temperature source Oral, resp. rate 18, height 5\' 4"  (1.626 m), weight 90.719 kg (200 lb), SpO2 95 %.   General:  Very pleasant female, awake and alert in NAD  Psych:  Normal affect and insight, Not Suicidal or Homicidal, Awake Alert, Oriented X 3.  Neuro:   Able to move all extremities x 4 without deficit. DTR's symmetric throughout. No facial droop. No pronator drift  ENT:  Rotational nystagmus. No diplopia or ptosis. Tympanic membranes without erythema or exudate, good light reflex  Neck:  Supple, No lymphadenopathy appreciated  Respiratory:   Symmetrical chest wall movement, Good air movement bilaterally, CTAB.  Cardiac:  RRR, No Murmurs, no LE edema noted, no JVD.    Abdomen:  Positive bowel sounds, Soft, Non tender, Non distended,  No masses appreciated  Skin:  No Cyanosis, Normal Skin Turgor, No Skin Rash or Bruise.  Extremities:  Able to move all 4. 5/5 strength in each,  no effusions.  Data Review  CBC  Recent Labs Lab 11/28/15 0250  WBC 13.2*  HGB 13.3  HCT 40.4  PLT 199  MCV 92.4  MCH 30.4  MCHC 32.9  RDW 14.0    Chemistries   Recent Labs Lab 11/28/15 0250  NA 141  K 4.3  CL 107  CO2 21*  GLUCOSE 147*  BUN 34*  CREATININE 2.07*  CALCIUM 9.4  AST 20  ALT 14  ALKPHOS 45  BILITOT 0.4    estimated creatinine clearance is 26.4 mL/min (by C-G formula based on Cr of 2.07).  No results for input(s): TSH, T4TOTAL, T3FREE, THYROIDAB in the last 72 hours.  Invalid input(s): FREET3  Coagulation profile No results for input(s): INR, PROTIME in the last 168 hours.  No results for input(s): DDIMER in the last 72 hours.  Cardiac Enzymes No results for input(s): CKMB, TROPONINI, MYOGLOBIN in the last 168 hours.  Invalid input(s): CK  Invalid input(s): POCBNP  Urinalysis    Component Value Date/Time   COLORURINE YELLOW 11/28/2015 [place] 11/28/2015 0637   LABSPEC 1.016 11/28/2015 0637   PHURINE 7.0 11/28/2015 0637   GLUCOSEU NEGATIVE 11/28/2015 0637   GLUCOSEU NEGATIVE 03/22/2015 1004   HGBUR NEGATIVE 11/28/2015 0637   BILIRUBINUR NEGATIVE 11/28/2015 0637   KETONESUR NEGATIVE 11/28/2015 0637   PROTEINUR NEGATIVE 11/28/2015 0637   UROBILINOGEN 0.2 03/22/2015 1004   NITRITE NEGATIVE 11/28/2015 0637   LEUKOCYTESUR NEGATIVE 11/28/2015 0637    Imaging results:   Ct Head Wo Contrast  11/28/2015  CLINICAL DATA:  Acute onset dizziness and vomiting. EXAM: CT HEAD WITHOUT CONTRAST TECHNIQUE: Contiguous axial images were obtained from the base of the skull through the vertex  without intravenous contrast. COMPARISON:  None. FINDINGS: Skull and Sinuses:Unremarkable right pterional craniotomy site. No acute osseous finding. No acute sinusitis or mastoiditis. Visualized orbits:  Noted gaze to the left. Brain: No evidence of acute infarction, hemorrhage, hydrocephalus, or mass lesion/mass effect. Sequela of right PCOM region aneurysm clipping. There is superficial gliosis in the right temporal pole and frontal operculum. Mild for age patchy low-density in the deep cerebral white matter attributed to chronic small  vessel disease. IMPRESSION: 1. No acute finding. 2. Prior right aneurysm clipping. 3. Mild chronic small vessel disease. Electronically Signed   By: Monte Fantasia M.D.   On: 11/28/2015 04:13    My personal review of EKG: NSR, No ST changes noted.   Assessment & Plan  Active Problems:   Hyperlipidemia   Essential hypertension   Hyperglycemia   Renal insufficiency   Benign positional vertigo  Vertigo -? BPPV vs vestibular neuritis vs other. Will tx for vestibular neuritis per neurology recommendations with Solumedrol 250mg  q6h. Will monitor to see if pt needs more valium -Will ask PT to evaluate for BPPV -Consider repeat heat CT in am to assess for CVA (no MRI d/t prior aneurysm clipping)  Acute on chronic renal insufficiency  -no previous w/u done though seems ongoing since 2012. Baseline Cr ~1.8 (2.07 today) -will hold home ACEI/diuretic indefinitely. Check renal panel -Consider outpt referral to nephrology -recheck labs in am after gentle IVF hydration  Leukocytosis -suspect reactive with recent vomiting. Pt is afebrile, nontoxic appearing, VSS -u/a unremarkable. Will check chest xray -suspect further inc with am labs given high dose of steroids  Hypertension -holding home ACEI/Diuretic given renal insufficiency -Add Norvasc 5mg  -Monitor  Hyperglycemia -hx of same. A1C 03/2015 5.7% -will place on SSI while inpt given high dose of steroids.    DVT Prophylaxis: SCDs  AM Labs Ordered, also please review Full Orders  Family Communication:  Daughter at bedside on exam  Code Status:  Full code  Condition:  stable  Time spent in minutes : [place], NP on 11/28/2015 at 11:26 AM Between 7am to 7pm - Pager - [phone] After 7pm go to [email or web address] - password [email or web address]  And look for the night coverage person covering me after hours  [facility]   Patient seen and examined. Agree with above note by Patrici Ranks, NP. Patient comes to the hospital today with severe vertigo and emesis. CT scan is negative, she cannot have an MRI due to an aneurysm clip. She has obvious horizontal nystagmus on exam, her symptoms are improved after Valium and meclizine. Has been seen by neurology who is giving her high-dose steroids for presumed vestibular neuritis. Will also have PT to see for vestibular therapy. As an aside, she has what appears to be acute on chronic kidney disease with a baseline creatinine around 1.6-1.8. Will discontinue hydrochlorothiazide/ACE inhibitor indefinitely and place her on Norvasc 5 mg for BP control.  Domingo Mend, MD [facility] Pager: [phone]

## 2015-11-28 NOTE — ED Notes (Signed)
Pt went out to walk her dogs, had onset of dizziness. Pt says that she then became nauseated and vomited clear yellow emesis. Dizziness has subsided, still nausea. C/o right ear is muffled x 1 week. en route nsr,  cbg 125, negative orthostatic changes,

## 2015-11-28 NOTE — ED Notes (Signed)
MD at bedside. 

## 2015-11-28 NOTE — Progress Notes (Signed)
New Admission Note:  Arrival Method: Ed stretcher Mental Orientation: alert and oriented Telemetry: placed on patient Assessment: Completed Skin: intact IV: right hand Pain: none Tubes: none Safety Measures: Safety Fall Prevention Plan was given, discussed and signed. Admission: Completed [facility] Orientation: Patient has been orientated to the room, unit and the staff. Family: present  Orders have been reviewed and implemented. Will continue to monitor the patient. Call light has been placed within reach and bed alarm has been activated.   Fabian Sharp, RN  Phone Number: [phone]

## 2015-11-28 NOTE — ED Provider Notes (Addendum)
Patient has been accepted at sign out from Dr. Rex Kras. Plan is for neurology consultation. I have reviewed the case with Dr. Silverio Decamp who will see the patient in the emergency department. Patient had initially been treated by Dr. Wendee Beavers and Dr. Rex Kras. She had gotten Valium and Antivert. I reassessed the patient at 09 30. She has significant improvement of any nausea. She reports she continues to feel that her eyes are circling about but she does not feel the intense nausea. There has not been any associated headache. Patient denies focal weakness numbness or tingling.   Her right ear had been irrigated by nursing staff. I reassessed this and the ear is clear with normal drum. The left ear had a small to moderate amount of residual cerumen with partial visualization of the drum.  Patient's mental status is alert and oriented. Cognitive function speech are normal. She continues to have rotary nystagmus with forward gaze. No pronator drift. Normal finger-nose examination. Normal heel shin examination.  Procedure: CERUMEN DISIMPACTION   I irrigated the left ear with approx. 146ml warm water and was able to have full visualization of the TM which is normal in appearance.   Patient does have history of prior aneurysm clipping. She has not had any incremental development of symptoms of headache, incoordination or other neurologic symptoms. At this time awaiting consultation by Dr. Silverio Decamp for further decision making on diagnostic imaging and definitive treatment.   Charlesetta Shanks, MD 11/28/15 [phone]  Dr. Silverio Decamp has seen the patient. At this time she cannot get MRI due to her aneurysm clip. He reports plan will be to treat her with high-dose steroids, Solu-Medrol 250 mg IV every 6 and Pepcid 20 mg to 12 for vestibular neuritis. Plan to admit to the hospitalist service with ongoing observation and potential repeat CT tomorrow.  Consultation: Hospitalist service for admission.  Charlesetta Shanks,  MD 11/28/15 1023

## 2015-11-29 ENCOUNTER — Encounter (HOSPITAL_COMMUNITY): Payer: Self-pay | Admitting: Physical Therapy

## 2015-11-29 DIAGNOSIS — H933X9 Disorders of unspecified acoustic nerve: Principal | ICD-10-CM

## 2015-11-29 DIAGNOSIS — R1110 Vomiting, unspecified: Secondary | ICD-10-CM

## 2015-11-29 DIAGNOSIS — H8112 Benign paroxysmal vertigo, left ear: Secondary | ICD-10-CM

## 2015-11-29 DIAGNOSIS — I10 Essential (primary) hypertension: Secondary | ICD-10-CM

## 2015-11-29 DIAGNOSIS — R739 Hyperglycemia, unspecified: Secondary | ICD-10-CM

## 2015-11-29 DIAGNOSIS — H8111 Benign paroxysmal vertigo, right ear: Secondary | ICD-10-CM

## 2015-11-29 DIAGNOSIS — H8113 Benign paroxysmal vertigo, bilateral: Secondary | ICD-10-CM

## 2015-11-29 DIAGNOSIS — R270 Ataxia, unspecified: Secondary | ICD-10-CM

## 2015-11-29 LAB — CBC
HCT: 39.4 % (ref 36.0–46.0)
Hemoglobin: 12.6 g/dL (ref 12.0–15.0)
MCH: 29.5 pg (ref 26.0–34.0)
MCHC: 32 g/dL (ref 30.0–36.0)
MCV: 92.3 fL (ref 78.0–100.0)
PLATELETS: 195 10*3/uL (ref 150–400)
RBC: 4.27 MIL/uL (ref 3.87–5.11)
RDW: 14.5 % (ref 11.5–15.5)
WBC: 7.2 10*3/uL (ref 4.0–10.5)

## 2015-11-29 LAB — GLUCOSE, CAPILLARY
GLUCOSE-CAPILLARY: 179 mg/dL — AB (ref 65–99)
GLUCOSE-CAPILLARY: 161 mg/dL — AB (ref 65–99)
GLUCOSE-CAPILLARY: 174 mg/dL — AB (ref 65–99)
Glucose-Capillary: 156 mg/dL — ABNORMAL HIGH (ref 65–99)

## 2015-11-29 LAB — BASIC METABOLIC PANEL
ANION GAP: 10 (ref 5–15)
BUN: 26 mg/dL — ABNORMAL HIGH (ref 6–20)
CALCIUM: 8.8 mg/dL — AB (ref 8.9–10.3)
CO2: 20 mmol/L — ABNORMAL LOW (ref 22–32)
CREATININE: 1.47 mg/dL — AB (ref 0.44–1.00)
Chloride: 111 mmol/L (ref 101–111)
GFR, EST AFRICAN AMERICAN: 40 mL/min — AB (ref >60)
GFR, EST NON AFRICAN AMERICAN: 34 mL/min — AB (ref >60)
Glucose, Bld: 163 mg/dL — ABNORMAL HIGH (ref 65–99)
Potassium: 3.7 mmol/L (ref 3.5–5.1)
SODIUM: 141 mmol/L (ref 135–145)

## 2015-11-29 MED ORDER — FAMOTIDINE 20 MG PO TABS
20.0000 mg | ORAL_TABLET | Freq: Every day | ORAL | Status: DC
  Administered 2015-11-29 – 2015-11-30 (×2): 20 mg via ORAL
  Filled 2015-11-29 (×2): qty 1

## 2015-11-29 MED ORDER — ENOXAPARIN SODIUM 40 MG/0.4ML ~~LOC~~ SOLN
40.0000 mg | SUBCUTANEOUS | Status: DC
  Administered 2015-11-29: 40 mg via SUBCUTANEOUS
  Filled 2015-11-29: qty 0.4

## 2015-11-29 MED ORDER — ATORVASTATIN CALCIUM 20 MG PO TABS
20.0000 mg | ORAL_TABLET | Freq: Every day | ORAL | Status: DC
  Administered 2015-11-29: 20 mg via ORAL
  Filled 2015-11-29: qty 1

## 2015-11-29 MED ORDER — SENNA 8.6 MG PO TABS
2.0000 | ORAL_TABLET | Freq: Every day | ORAL | Status: DC
  Administered 2015-11-29 – 2015-11-30 (×2): 17.2 mg via ORAL
  Filled 2015-11-29 (×2): qty 2

## 2015-11-29 NOTE — Progress Notes (Signed)
Interval History:                                                                                                                      Amber Hampton is an 75 y.o. female patient with HTN, breast CA and a prior aneurysmal clip that  is not MRI compatible.  Admitted for further workup and management of acute vertigo.   patient continues to have worsening vertigo symptoms, no other new neurological symptoms otherwise.   Past Medical History: Past Medical History  Diagnosis Date  . BREAST CANCER, HX OF 06/18/2007  . HYPERLIPIDEMIA 06/18/2007  . HYPERTENSION 06/18/2007  . ARTHRALGIA 01/12/2008  . HYPERGLYCEMIA 06/18/2007  . ASYMPTOMATIC POSTMENOPAUSAL STATUS 01/19/2008  . TOBACCO USE, QUIT 01/30/2010    Past Surgical History  Procedure Laterality Date  . Aneurysm right side head  1996    s/p aneurysm clipping (NO MRI)  . Breast lumpectomy    . Cerebral aneurysm repair  1996    clipping (NO MRI)    Family History: Family History  Problem Relation Age of Onset  . Cancer Neg Hx     No FH except for pt    Social History:   reports that she quit smoking about 9 years ago. She does not have any smokeless tobacco history on file. She reports that she does not drink alcohol or use illicit drugs.  Allergies:  No Known Allergies   Medications:                                                                                                                         Current facility-administered medications:  .  0.9 %  sodium chloride infusion, , Intravenous, Continuous, Reyne Dumas, MD, Last Rate: 100 mL/hr at 11/29/15 1405 .  acetaminophen (TYLENOL) tablet 650 mg, 650 mg, Oral, Q6H PRN, 650 mg at 11/29/15 0842 **OR** acetaminophen (TYLENOL) suppository 650 mg, 650 mg, Rectal, Q6H PRN, Dionne Milo, NP .  amLODipine (NORVASC) tablet 5 mg, 5 mg, Oral, Daily, Dionne Milo, NP, 5 mg at 11/29/15 0820 .  atorvastatin (LIPITOR) tablet 20 mg, 20 mg, Oral, q1800, Donalynn Furlong [place], RPH .   enoxaparin (LOVENOX) injection 40 mg, 40 mg, Subcutaneous, Q24H, Donalynn Furlong [place], [facility] .  famotidine (PEPCID) tablet 20 mg, 20 mg, Oral, Daily, Donalynn Furlong [place], RPH, 20 mg at 11/29/15 [id number] .  insulin aspart (novoLOG) injection 0-9 Units, 0-9 Units, Subcutaneous, TID WC, Dionne Milo, NP, 2 Units  at 11/29/15 1237 .  methylPREDNISolone sodium succinate (SOLU-MEDROL) 250 mg in sodium chloride 0.9 % 50 mL IVPB, 250 mg, Intravenous, Q6H, Charlesetta Shanks, MD, 250 mg at 11/29/15 1237 .  ondansetron (ZOFRAN) tablet 4 mg, 4 mg, Oral, Q6H PRN **OR** ondansetron (ZOFRAN) injection 4 mg, 4 mg, Intravenous, Q6H PRN, Dionne Milo, NP .  senna (SENOKOT) tablet 17.2 mg, 2 tablet, Oral, Daily, Reyne Dumas, MD, 17.2 mg at 11/29/15 1446   Neurologic Examination:                                                                                                     Today's Vitals   11/28/15 2245 11/29/15 0110 11/29/15 0835 11/29/15 1451  BP:    147/67  Pulse:    92  Temp:    97.8 F (36.6 C)  TempSrc:    Oral  Resp:    16  Height:      Weight:      SpO2:    97%  PainSc: 0-No pain Asleep 4      Evaluation of higher integrative functions including: Level of alertness: Alert,  Oriented to time, place and person Speech: fluent, no evidence of dysarthria or aphasia noted.  Test the following cranial nerves: bilateral end gaze nystagmus noted , with severe truncal ataxia,  Otherwise rest of the cranial nerves grossly intact Motor examination: full 5/5 motor strength in all 4 extremities Examination of sensation :  symmetric sensation to pinprick in all 4 extremities and on face Test coordination: Normal finger nose testing, with no evidence of limb appendicular ataxia or abnormal involuntary movements or tremors noted.  Patient has truncal and gait ataxia Gait: gait ataxia noted   Lab Results: Basic Metabolic Panel:  Recent Labs Lab 11/28/15 0250 11/28/15 1800 11/29/15 0550  NA 141 140  141  K 4.3 3.8 3.7  CL 107 107 111  CO2 21* 21* 20*  GLUCOSE 147* 158* 163*  BUN 34* 26* 26*  CREATININE 2.07* 1.60* 1.47*  CALCIUM 9.4 9.3 8.8*  PHOS  --  2.3*  --     Liver Function Tests:  Recent Labs Lab 11/28/15 0250 11/28/15 1800  AST 20  --   ALT 14  --   ALKPHOS 45  --   BILITOT 0.4  --   PROT 6.8  --   ALBUMIN 3.7 3.6   No results for input(s): LIPASE, AMYLASE in the last 168 hours. No results for input(s): AMMONIA in the last 168 hours.  CBC:  Recent Labs Lab 11/28/15 0250 11/28/15 1800 11/29/15 0550  WBC 13.2* 8.4 7.2  NEUTROABS  --  7.6  --   HGB 13.3 13.3 12.6  HCT 40.4 41.9 39.4  MCV 92.4 93.3 92.3  PLT 199 206 195    Cardiac Enzymes: No results for input(s): CKTOTAL, CKMB, CKMBINDEX, TROPONINI in the last 168 hours.  Lipid Panel: No results for input(s): CHOL, TRIG, HDL, CHOLHDL, VLDL, LDLCALC in the last 168 hours.  CBG:  Recent Labs Lab 11/29/15 0801 11/29/15 1154  GLUCAP 179* 161*  Microbiology: No results found for this or any previous visit.  Imaging: X-ray Chest Pa And Lateral  11/28/2015  CLINICAL DATA:  Leukocytosis.  Vertigo. EXAM: CHEST  2 VIEW COMPARISON:  None. FINDINGS: The heart size and mediastinal contours are within normal limits. Both lungs are clear. The visualized skeletal structures are unremarkable. IMPRESSION: No active cardiopulmonary disease. Electronically Signed   By: Franki Cabot M.D.   On: 11/28/2015 18:35   Ct Head Wo Contrast  11/28/2015  CLINICAL DATA:  Acute onset dizziness and vomiting. EXAM: CT HEAD WITHOUT CONTRAST TECHNIQUE: Contiguous axial images were obtained from the base of the skull through the vertex without intravenous contrast. COMPARISON:  None. FINDINGS: Skull and Sinuses:Unremarkable right pterional craniotomy site. No acute osseous finding. No acute sinusitis or mastoiditis. Visualized orbits:  Noted gaze to the left. Brain: No evidence of acute infarction, hemorrhage, hydrocephalus,  or mass lesion/mass effect. Sequela of right PCOM region aneurysm clipping. There is superficial gliosis in the right temporal pole and frontal operculum. Mild for age patchy low-density in the deep cerebral white matter attributed to chronic small vessel disease. IMPRESSION: 1. No acute finding. 2. Prior right aneurysm clipping. 3. Mild chronic small vessel disease. Electronically Signed   By: Monte Fantasia M.D.   On: 11/28/2015 04:13    Assessment and plan:   MAGHAN SCHWARTING is an 75 y.o. female patient with prior history of intracranial aneurysm coiling, not MRI compatible. Presented with acute  Persistent vertigo symptoms,  With nystagmus, nausea vomiting and truncal and gait ataxia. Most likely. Be a peripheral vestibular neuritis based on clinical evaluation. Recommend CT angiogram of the head and neck to rule out significant vertebrobasilar pathology contributing to her symptoms. This Will be helpful as a repeat CT scan compared to initial CT  To evaluate for any acute stroke.   for management of her acute severe persistent vertigo symptoms, recommend IV Solu-Medrol 250 mg every 6 hours.    we'll follow-up

## 2015-11-29 NOTE — Progress Notes (Signed)
[facility] PROGRESS NOTE  Amber Hampton [id number] DOB: [birth year] DOA: 11/28/2015 PCP: [name], MD  Length of stay: 1   Assessment/Plan: Active Problems:   Hyperlipidemia   Essential hypertension   Hyperglycemia   Renal insufficiency   Benign positional vertigo   Vertigo   75 y.o. female with pmh of hypertension, hyperlipidemia, hyperglycemia and chronic renal insufficiency presented to ED this am with complaints of dizziness. Pt states she felt fine before going to bed last evening around 9:30. She awoke at midnight to let dog out. When she sat up in bed she experienced severe dizziness, n/v. Pt describes dizziness as "world spinning around her". She was unable to get up off of bathroom floor due to dizziness and vomiting prompting husband to call EMS.   On arrival to ED, pt's vertigo persisted. It is now much improved after receiving IV valium. A large amount of earwax was removed from right ear by EDP. She continues to have nystagmus. Neurology was initially consulted for concern of stroke. Pt unable to have MRI d/t previous aneurysm clipping. Otherwise, labs remarkable for slight elevation in WBC Of 13.2 and Creatinine of 2.07 (baseline ~1.8). Pt denies weakness, slurred speech, facial droop,recent fever, headache, chest pain, sob, adominal pain or dysuria. Plan at this time is to admit for further w/u of vertigo.    Assessment and plan Vertigo -? BPPV vs vestibular neuritis vs other. Will tx for vestibular neuritis per neurology recommendations with Solumedrol 250mg  q6h. will continue for another 24 hours as per neurology recommendations  -Will ask PT to evaluate for BPPV Patient would need CTA of the head and neck tomorrow, to assess for CVA (no MRI d/t prior aneurysm clipping)  Acute on chronic renal insufficiency  -no previous w/u done though seems ongoing since 2012. Baseline Cr ~1.8 (2.07 today) -will hold home ACEI/diuretic indefinitely. Continue IV  fluids pending contrast study tomorrow -Consider outpt referral to nephrology -recheck labs in am after gentle IVF hydration  Leukocytosis -suspect reactive with recent vomiting. Pt is afebrile, nontoxic appearing, VSS -u/a unremarkable. Will check chest xray -suspect further inc with am labs given high dose of steroids  Hypertension -holding home ACEI/Diuretic given renal insufficiency -Add Norvasc 5mg  -Monitor  Hyperglycemia -hx of same. A1C 03/2015 5.7% -will place on SSI while inpt given high dose of steroids.   DVT Prophylaxis: SCDs  AM Labs Ordered, also please review Full Orders  Family Communication:Husband at bedside on exam    DVT prophylaxsis scd's , lovenox   Code Status:      Code Status Orders        Start     Ordered   11/28/15 1731  Full code   Continuous     11/28/15 1730    Code Status History    Date Active Date Inactive Code Status Order ID Comments User Context   This patient has a current code status but no historical code status.      Family Communication: Discussed in detail with the patient and husband , all imaging results, lab results explained to the patient   Disposition Plan:  Dc in am per neurology      Consultants:  Neurology    Procedures:  None   Antibiotics: Anti-infectives    None         HPI/Subjective: 50% improvement in vertigo and diplopia  Objective: Filed Vitals:   11/28/15 1424 11/28/15 1425 11/28/15 1500 11/28/15 2238  BP: 102/73  120/75 125/63  Pulse:  79  95  Temp:    97.4 F (36.3 C)  TempSrc:    Oral  Resp:  14  15  Height:      Weight:      SpO2:  97%  95%    Intake/Output Summary (Last 24 hours) at 11/29/15 1409 Last data filed at 11/29/15 1204  Gross per 24 hour  Intake    240 ml  Output      1 ml  Net    239 ml    Exam:  General: No acute respiratory distress Lungs: Clear to auscultation bilaterally without wheezes or crackles Cardiovascular: Regular rate and rhythm  without murmur gallop or rub normal S1 and S2 Abdomen: Nontender, nondistended, soft, bowel sounds positive, no rebound, no ascites, no appreciable mass Extremities: No significant cyanosis, clubbing, or edema bilateral lower extremities     Data Review   Micro Results No results found for this or any previous visit (from the past 240 hour(s)).  Radiology Reports X-ray Chest Pa And Lateral  11/28/2015  CLINICAL DATA:  Leukocytosis.  Vertigo. EXAM: CHEST  2 VIEW COMPARISON:  None. FINDINGS: The heart size and mediastinal contours are within normal limits. Both lungs are clear. The visualized skeletal structures are unremarkable. IMPRESSION: No active cardiopulmonary disease. Electronically Signed   By: Franki Cabot M.D.   On: 11/28/2015 18:35   Ct Head Wo Contrast  11/28/2015  CLINICAL DATA:  Acute onset dizziness and vomiting. EXAM: CT HEAD WITHOUT CONTRAST TECHNIQUE: Contiguous axial images were obtained from the base of the skull through the vertex without intravenous contrast. COMPARISON:  None. FINDINGS: Skull and Sinuses:Unremarkable right pterional craniotomy site. No acute osseous finding. No acute sinusitis or mastoiditis. Visualized orbits:  Noted gaze to the left. Brain: No evidence of acute infarction, hemorrhage, hydrocephalus, or mass lesion/mass effect. Sequela of right PCOM region aneurysm clipping. There is superficial gliosis in the right temporal pole and frontal operculum. Mild for age patchy low-density in the deep cerebral white matter attributed to chronic small vessel disease. IMPRESSION: 1. No acute finding. 2. Prior right aneurysm clipping. 3. Mild chronic small vessel disease. Electronically Signed   By: Monte Fantasia M.D.   On: 11/28/2015 04:13     CBC  Recent Labs Lab 11/28/15 0250 11/28/15 1800 11/29/15 0550  WBC 13.2* 8.4 7.2  HGB 13.3 13.3 12.6  HCT 40.4 41.9 39.4  PLT 199 206 195  MCV 92.4 93.3 92.3  MCH 30.4 29.6 29.5  MCHC 32.9 31.7 32.0  RDW  14.0 14.2 14.5  LYMPHSABS  --  0.8  --   MONOABS  --  0.0*  --   EOSABS  --  0.0  --   BASOSABS  --  0.0  --     Chemistries   Recent Labs Lab 11/28/15 0250 11/28/15 1800 11/29/15 0550  NA 141 140 141  K 4.3 3.8 3.7  CL 107 107 111  CO2 21* 21* 20*  GLUCOSE 147* 158* 163*  BUN 34* 26* 26*  CREATININE 2.07* 1.60* 1.47*  CALCIUM 9.4 9.3 8.8*  AST 20  --   --   ALT 14  --   --   ALKPHOS 45  --   --   BILITOT 0.4  --   --    ------------------------------------------------------------------------------------------------------------------ estimated creatinine clearance is 37.2 mL/min (by C-G formula based on Cr of 1.47). ------------------------------------------------------------------------------------------------------------------ No results for input(s): HGBA1C in the last 72 hours. ------------------------------------------------------------------------------------------------------------------ No results for  input(s): CHOL, HDL, LDLCALC, TRIG, CHOLHDL, LDLDIRECT in the last 72 hours. ------------------------------------------------------------------------------------------------------------------ No results for input(s): TSH, T4TOTAL, T3FREE, THYROIDAB in the last 72 hours.  Invalid input(s): FREET3 ------------------------------------------------------------------------------------------------------------------ No results for input(s): VITAMINB12, FOLATE, FERRITIN, TIBC, IRON, RETICCTPCT in the last 72 hours.  Coagulation profile No results for input(s): INR, PROTIME in the last 168 hours.  No results for input(s): DDIMER in the last 72 hours.  Cardiac Enzymes No results for input(s): CKMB, TROPONINI, MYOGLOBIN in the last 168 hours.  Invalid input(s): CK ------------------------------------------------------------------------------------------------------------------ Invalid input(s): POCBNP   CBG:  Recent Labs Lab 11/29/15 0801 11/29/15 1154  GLUCAP 179*  161*       Studies: X-ray Chest Pa And Lateral  11/28/2015  CLINICAL DATA:  Leukocytosis.  Vertigo. EXAM: CHEST  2 VIEW COMPARISON:  None. FINDINGS: The heart size and mediastinal contours are within normal limits. Both lungs are clear. The visualized skeletal structures are unremarkable. IMPRESSION: No active cardiopulmonary disease. Electronically Signed   By: Franki Cabot M.D.   On: 11/28/2015 18:35   Ct Head Wo Contrast  11/28/2015  CLINICAL DATA:  Acute onset dizziness and vomiting. EXAM: CT HEAD WITHOUT CONTRAST TECHNIQUE: Contiguous axial images were obtained from the base of the skull through the vertex without intravenous contrast. COMPARISON:  None. FINDINGS: Skull and Sinuses:Unremarkable right pterional craniotomy site. No acute osseous finding. No acute sinusitis or mastoiditis. Visualized orbits:  Noted gaze to the left. Brain: No evidence of acute infarction, hemorrhage, hydrocephalus, or mass lesion/mass effect. Sequela of right PCOM region aneurysm clipping. There is superficial gliosis in the right temporal pole and frontal operculum. Mild for age patchy low-density in the deep cerebral white matter attributed to chronic small vessel disease. IMPRESSION: 1. No acute finding. 2. Prior right aneurysm clipping. 3. Mild chronic small vessel disease. Electronically Signed   By: Monte Fantasia M.D.   On: 11/28/2015 04:13      Lab Results  Component Value Date   HGBA1C 5.7 03/22/2015   HGBA1C 5.8 10/05/2013   HGBA1C 5.9 10/05/2012   Lab Results  Component Value Date   LDLCALC 56 03/22/2015   CREATININE 1.47* 11/29/2015       Scheduled Meds: . amLODipine  5 mg Oral Daily  . famotidine  20 mg Oral Daily  . insulin aspart  0-9 Units Subcutaneous TID WC  . methylPREDNISolone (SOLU-MEDROL) injection  250 mg Intravenous Q6H  . senna  2 tablet Oral Daily  . simvastatin  40 mg Oral q1800   Continuous Infusions: . sodium chloride 75 mL/hr at 11/29/15 0548    Active  Problems:   Hyperlipidemia   Essential hypertension   Hyperglycemia   Renal insufficiency   Benign positional vertigo   Vertigo    Time spent: 45 minutes   [facility] Pager [phone]. If 7PM-7AM, please contact night-coverage at [email or web address], password [place] 11/29/2015, 2:09 PM  LOS: 1 day

## 2015-11-29 NOTE — Evaluation (Signed)
Physical Therapy Evaluation Patient Details Name: Amber Hampton MRN: [id number] DOB: [birth year] Today's Date: 11/29/2015   History of Present Illness  75 y.o. female admitted to [facility] on 11/28/15 for acute onset dizziness, N/V.  Suspected vestibular component, and unable to do MRI due to aneurysm clipping from 21 years ago.  Pt with other significant PMHx of breast CA, and HTN.  Clinical Impression  Pt was able to mobilize safely with minimal assist and RW.  She does have symptoms of a R ear labarynthitis (see details of testing below).  Pt is due to have a CTA to assess for stroke tomorrow.  I do recommend she get follow up at our Outpatient neuro rehab clinic for vestibular and balance therapy at discharge.  PT to follow acutely for deficits listed below.       Follow Up Recommendations Outpatient PT;Other (comment) (OP PT at [facility] for balance/vertigo)    Equipment Recommendations  None recommended by PT    Recommendations for Other Services   NA    Precautions / Restrictions Precautions Precautions: Fall Precaution Comments: due to feeling of imbalance when on her feet      Mobility  Bed Mobility Overal bed mobility: Modified Independent                Transfers Overall transfer level: Needs assistance   Transfers: Sit to/from Stand Sit to Stand: Supervision         General transfer comment: supervision for safety due to general feeling of being off balance  Ambulation/Gait Ambulation/Gait assistance: Min guard Ambulation Distance (Feet): 150 Feet Assistive device: Rolling walker (2 wheeled) Gait Pattern/deviations: Step-through pattern;Staggering right;Staggering left Gait velocity: decreased Gait velocity interpretation: Below normal speed for age/gender General Gait Details: mildly staggering gait pattern even with RW.  Verbal cues to find target to help with stability during gait.        Modified Rankin (Stroke Patients Only) Modified Rankin  (Stroke Patients Only) Pre-Morbid Rankin Score: No symptoms Modified Rankin: Moderately severe disability     Balance Overall balance assessment: Needs assistance Sitting-balance support: Feet supported;No upper extremity supported Sitting balance-Leahy Scale: Good     Standing balance support: Bilateral upper extremity supported;No upper extremity supported;Single extremity supported Standing balance-Leahy Scale: Fair                               Pertinent Vitals/Pain Pain Assessment: No/denies pain    Home Living Family/patient expects to be discharged to:: Private residence Living Arrangements: Spouse/significant other Available Help at Discharge: Family Type of Home: House       Home Layout: One level Home Equipment: Environmental consultant - 2 wheels      Prior Function Level of Independence: Independent         Comments: retired        Extremity/Trunk Assessment   Upper Extremity Assessment: Overall WFL for tasks assessed           Lower Extremity Assessment: Overall WFL for tasks assessed      Cervical / Trunk Assessment: Normal  Communication   Communication: No difficulties  Cognition Arousal/Alertness: Awake/alert Behavior During Therapy: WFL for tasks assessed/performed Overall Cognitive Status: Within Functional Limits for tasks assessed                               Assessment/Plan    PT Assessment Patient needs continued PT  services  PT Diagnosis Difficulty walking;Abnormality of gait;Generalized weakness;Other (comment) (vertigo)   PT Problem List Decreased strength;Decreased activity tolerance;Decreased mobility;Decreased balance  PT Treatment Interventions DME instruction;Gait training;Stair training;Therapeutic activities;Functional mobility training;Therapeutic exercise;Balance training;Neuromuscular re-education;Patient/family education   PT Goals (Current goals can be found in the Care Plan section) Acute Rehab PT  Goals Patient Stated Goal: to get better soon PT Goal Formulation: With patient/family Time For Goal Achievement: 12/13/15 Potential to Achieve Goals: Good    Frequency Min 3X/week           End of Session Equipment Utilized During Treatment: Gait belt Activity Tolerance: Patient tolerated treatment well Patient left: in chair;with call bell/phone within reach;with family/visitor present           Time: [id number] PT Time Calculation (min) (ACUTE ONLY): 36 min   Charges:   PT Evaluation $PT Eval Moderate Complexity: 1 Procedure PT Treatments $Therapeutic Activity: 8-22 mins        Foster Sonnier B. Cheral Cappucci, PT, DPT [phone]   11/29/2015, 5:11 PM

## 2015-11-29 NOTE — Progress Notes (Signed)
Pharmacy - Formulary Substitution   Simvastatin dose not to exceed 20 mg/d while on amlodipine therefore atorvastatin 20 mg/d was substituted. Consider this on discharge if amlodipine continued.  [place], Pharm.D., BCPS Clinical Pharmacist Pager: [phone] 11/29/2015 3:11 PM

## 2015-11-29 NOTE — Care Management Note (Signed)
Case Management Note  Patient Details  Name: LIANA DEENER MRN: [id number] Date of Birth: July 30, 1941  Subjective/Objective:                 Spoke to patient and family about DC needs. They declined barriers to care, and stated theu have access to a walker. Patient lives at home with spouse.    Action/Plan:  Anticipate vestibular PT as outpatient. Will continue to follow.  Expected Discharge Date:                  Expected Discharge Plan:  Home/Self Care  In-House Referral:     Discharge planning Services  CM Consult  Post Acute Care Choice:    Choice offered to:  Patient  DME Arranged:    DME Agency:     HH Arranged:    [place] Agency:     Status of Service:  In process, will continue to follow  Medicare Important Message Given:    Date Medicare IM Given:    Medicare IM give by:    Date Additional Medicare IM Given:    Additional Medicare Important Message give by:     If discussed at [place] of Stay Meetings, dates discussed:    Additional Comments:  Carles Collet, RN 11/29/2015, 4:29 PM

## 2015-11-29 NOTE — Clinical Documentation Improvement (Signed)
Internal Medicine  Can the diagnosis of CKD be further specified?   CKD Stage I - GFR greater than or equal to 90  CKD Stage II - GFR 60-89  CKD Stage III - GFR 30-59  CKD Stage IV - GFR 15-29  CKD Stage V - GFR < 15  ESRD (End Stage Renal Disease)  Other condition  Unable to clinically determine   Please update your documentation within the medical record to reflect your response to this query. Thank you.  Supporting Information: (As per notes) Acute on chronic renal insufficiency  -no previous w/u done though seems ongoing since 2012. Baseline Cr ~1.8 (2.07 today)  -will hold home ACEI/diuretic indefinitely. Check renal panel  -Consider outpt referral to nephrology  -recheck labs in am after gentle IVF hydration  Labs: Component     Latest Ref Rng 11/28/2015 11/28/2015 11/29/2015         2:50 AM  6:00 PM   BUN     6 - 20 mg/dL 34 (H) 26 (H) 26 (H)  Creatinine     0.44 - 1.00 mg/dL 2.07 (H) 1.60 (H) 1.47 (H)  EGFR (Non-African Amer.)     >60 mL/min 23 (L) 31 (L) 34 (L)  EGFR (African American)     >60 mL/min 26 (L) 36 (L) 40 (L)   Please exercise your independent, professional judgment when responding. A specific answer is not anticipated or expected.  Thank You, Alessandra Grout, RN, BSN, [place] Documentation Specialist:  [phone]  [phone]=[facility] [place] [facility]  [phone]

## 2015-11-30 ENCOUNTER — Inpatient Hospital Stay (HOSPITAL_COMMUNITY): Payer: Commercial Managed Care - HMO

## 2015-11-30 ENCOUNTER — Encounter (HOSPITAL_COMMUNITY): Payer: Self-pay | Admitting: Radiology

## 2015-11-30 LAB — COMPREHENSIVE METABOLIC PANEL
ALK PHOS: 39 U/L (ref 38–126)
ALT: 14 U/L (ref 14–54)
AST: 23 U/L (ref 15–41)
Albumin: 3.1 g/dL — ABNORMAL LOW (ref 3.5–5.0)
Anion gap: 10 (ref 5–15)
BILIRUBIN TOTAL: 0.3 mg/dL (ref 0.3–1.2)
BUN: 30 mg/dL — AB (ref 6–20)
CALCIUM: 8.4 mg/dL — AB (ref 8.9–10.3)
CHLORIDE: 110 mmol/L (ref 101–111)
CO2: 21 mmol/L — ABNORMAL LOW (ref 22–32)
CREATININE: 1.38 mg/dL — AB (ref 0.44–1.00)
GFR calc Af Amer: 43 mL/min — ABNORMAL LOW (ref >60)
GFR, EST NON AFRICAN AMERICAN: 37 mL/min — AB (ref >60)
Glucose, Bld: 158 mg/dL — ABNORMAL HIGH (ref 65–99)
Potassium: 3.1 mmol/L — ABNORMAL LOW (ref 3.5–5.1)
Sodium: 141 mmol/L (ref 135–145)
TOTAL PROTEIN: 5.8 g/dL — AB (ref 6.5–8.1)

## 2015-11-30 LAB — CBC
HCT: 36.6 % (ref 36.0–46.0)
Hemoglobin: 12.2 g/dL (ref 12.0–15.0)
MCH: 30.8 pg (ref 26.0–34.0)
MCHC: 33.3 g/dL (ref 30.0–36.0)
MCV: 92.4 fL (ref 78.0–100.0)
PLATELETS: 197 10*3/uL (ref 150–400)
RBC: 3.96 MIL/uL (ref 3.87–5.11)
RDW: 14.7 % (ref 11.5–15.5)
WBC: 13.1 10*3/uL — AB (ref 4.0–10.5)

## 2015-11-30 LAB — GLUCOSE, CAPILLARY: GLUCOSE-CAPILLARY: 147 mg/dL — AB (ref 65–99)

## 2015-11-30 MED ORDER — POTASSIUM CHLORIDE CRYS ER 20 MEQ PO TBCR: 40.0000 meq | EXTENDED_RELEASE_TABLET | Freq: Once | ORAL | Status: DC

## 2015-11-30 MED ORDER — LISINOPRIL-HYDROCHLOROTHIAZIDE 20-12.5 MG PO TABS: ORAL_TABLET | ORAL | Status: DC

## 2015-11-30 MED ORDER — IOHEXOL 350 MG/ML SOLN
50.0000 mL | Freq: Once | INTRAVENOUS | Status: AC | PRN
  Administered 2015-11-30: 50 mL via INTRAVENOUS

## 2015-11-30 MED ORDER — POTASSIUM CHLORIDE CRYS ER 20 MEQ PO TBCR: 40.0000 meq | EXTENDED_RELEASE_TABLET | Freq: Every day | ORAL | Status: DC

## 2015-11-30 MED ORDER — METHYLPREDNISOLONE 4 MG PO TBPK: ORAL_TABLET | ORAL | Status: DC

## 2015-11-30 MED ORDER — ASPIRIN 325 MG PO TABS: 325.0000 mg | ORAL_TABLET | Freq: Every day | ORAL | Status: DC

## 2015-11-30 MED ORDER — ONDANSETRON HCL 4 MG PO TABS: 4.0000 mg | ORAL_TABLET | Freq: Four times a day (QID) | ORAL | Status: DC | PRN

## 2015-11-30 MED ORDER — POTASSIUM CHLORIDE CRYS ER 20 MEQ PO TBCR
60.0000 meq | EXTENDED_RELEASE_TABLET | Freq: Once | ORAL | Status: AC
  Administered 2015-11-30: 60 meq via ORAL
  Filled 2015-11-30: qty 3

## 2015-11-30 NOTE — Discharge Instructions (Signed)
Discuss with PCP about resuming lisinopril -HCTZ

## 2015-11-30 NOTE — Care Management Note (Signed)
Case Management Note  Patient Details  Name: Amber Hampton MRN: [id number] Date of Birth: Oct 15, 1940  Subjective/Objective:                  dizziness Action/Plan: Discharge planning Expected Discharge Date:  11/30/15               Expected Discharge Plan:  Home/Self Care  In-House Referral:     Discharge planning Services  CM Consult  Post Acute Care Choice:    Choice offered to:  Patient  DME Arranged:  N/A DME Agency:  NA  HH Arranged:  NA HH Agency:     Status of Service:  Completed, signed off  Medicare Important Message Given:    Date Medicare IM Given:    Medicare IM give by:    Date Additional Medicare IM Given:    Additional Medicare Important Message give by:     If discussed at [place] of Stay Meetings, dates discussed:    Additional Comments: CM met with pt and family and gave map and written directions to [facility].  [facility] who will call pt on Monday to schedule appointment.  No other CM needs were communicated. Dellie Catholic, RN 11/30/2015, 10:46 AM

## 2015-11-30 NOTE — Discharge Summary (Signed)
Physician Discharge Summary  Amber Hampton MRN: [id number] DOB/AGE: [birth year] 74 y.o.  PCP: [name], MD   Admit date: 11/28/2015 Discharge date: 11/30/2015  Discharge Diagnoses:     Active Problems:   Hyperlipidemia   Essential hypertension   Hyperglycemia   Renal insufficiency   Benign positional vertigo   Vertigo    Follow-up recommendations Follow-up with PCP in 3-5 days , including all  additional recommended appointments as below Follow-up CBC, CMP in 3-5 days  Discuss with PCP about resuming lisinopril -HCTZ     Medication List    TAKE these medications        aspirin 325 MG tablet  Take 1 tablet (325 mg total) by mouth daily.     calcium carbonate 600 MG Tabs tablet  Commonly known as:  OS-CAL  Take 600 mg by mouth daily.     lisinopril-hydrochlorothiazide 20-12.5 MG tablet  Commonly known as:  PRINZIDE,ZESTORETIC  TAKE 2 TABLETS DAILY  Start taking on:  12/09/2015     methylPREDNISolone 4 MG Tbpk tablet  Commonly known as:  MEDROL DOSEPAK  Per Dosepak instructions     ondansetron 4 MG tablet  Commonly known as:  ZOFRAN  Take 1 tablet (4 mg total) by mouth every 6 (six) hours as needed for nausea.     potassium chloride SA 20 MEQ tablet  Commonly known as:  K-DUR,KLOR-CON  Take 2 tablets (40 mEq total) by mouth daily.     simvastatin 40 MG tablet  Commonly known as:  ZOCOR  1 tab qhs         Discharge Condition:    Discharge Instructions Get Medicines reviewed and adjusted: Please take all your medications with you for your next visit with your Primary MD  Please request your Primary MD to go over all hospital tests and procedure/radiological results at the follow up, please ask your Primary MD to get all Hospital records sent to his/her office.  If you experience worsening of your admission symptoms, develop shortness of breath, life threatening emergency, suicidal or homicidal thoughts you must seek medical attention immediately  by calling 911 or calling your MD immediately if symptoms less severe.  You must read complete instructions/literature along with all the possible adverse reactions/side effects for all the Medicines you take and that have been prescribed to you. Take any new Medicines after you have completely understood and accpet all the possible adverse reactions/side effects.   Do not drive when taking Pain medications.   Do not take more than prescribed Pain, Sleep and Anxiety Medications  Special Instructions: If you have smoked or chewed Tobacco in the last 2 yrs please stop smoking, stop any regular Alcohol and or any Recreational drug use.  Wear Seat belts while driving.  Please note  You were cared for by a hospitalist during your hospital stay. Once you are discharged, your primary care physician will handle any further medical issues. Please note that NO REFILLS for any discharge medications will be authorized once you are discharged, as it is imperative that you return to your primary care physician (or establish a relationship with a primary care physician if you do not have one) for your aftercare needs so that they can reassess your need for medications and monitor your lab values.      Discharge Instructions    Diet - low sodium heart healthy    Complete by:  As directed      Increase activity slowly    Complete  by:  As directed            No Known Allergies    Disposition: Final discharge disposition not confirmed   Consults: neurology    Significant Diagnostic Studies:  Ct Angio Head W/cm &/or Wo Cm  11/30/2015  CLINICAL DATA:  Vertigo and headache.  History aneurysm clipping EXAM: CT ANGIOGRAPHY HEAD AND NECK TECHNIQUE: Multidetector CT imaging of the head and neck was performed using the standard protocol during bolus administration of intravenous contrast. Multiplanar CT image reconstructions and MIPs were obtained to evaluate the vascular anatomy. Carotid stenosis  measurements (when applicable) are obtained utilizing NASCET criteria, using the distal internal carotid diameter as the denominator. CONTRAST:  68m OMNIPAQUE IOHEXOL 350 MG/ML SOLN COMPARISON:  CT head 11/28/2015 FINDINGS: CT HEAD Brain: Right pterional craniotomy for aneurysm clipping. Aneurysm clip in the region of the right posterior communicating artery. Negative for subarachnoid hemorrhage. Generalized atrophy without hydrocephalus. Mild chronic ischemic change. No acute infarct or mass. Calvarium and skull base: Right pterional craniotomy changes. No acute bony abnormality Paranasal sinuses:  clear Orbits: Negative CTA NECK Aortic arch: Mild atherosclerotic calcification aortic arch and proximal great vessels. Negative for aneurysm or dissection. Proximal great vessels are patent without significant stenosis. Lung apices clear. Right carotid system: Right common carotid artery widely patent. Mild atherosclerotic plaque at the carotid bifurcation without significant stenosis. Tortuosity right internal carotid artery. Left carotid system: Left common carotid artery widely patent. Minimal atherosclerotic disease at the bifurcation without stenosis. Vertebral arteries:Left vertebral dominant. Both vertebral arteries patent to the basilar without significant stenosis or dissection Skeleton: Cervical spondylosis, moderate. No fracture or acute bony abnormality. Thoracic levoscoliosis and cervical dextroscoliosis. Other neck: Negative for mass or adenopathy in the neck. CTA HEAD Anterior circulation: Moderate atherosclerotic calcification in the cavernous carotid bilaterally with mild stenosis bilaterally. Right anterior and middle cerebral arteries patent bilaterally without significant stenosis. Aneurysm clip in the region of the right posterior communicating artery. Clip causes streak artifact. Allowing for this, no recurrent aneurysm identified in the area. Posterior circulation: Both vertebral arteries patent  to the basilar. PICA patent bilaterally. Basilar patent. Superior cerebellar and posterior cerebral arteries patent bilaterally without significant stenosis. Venous sinuses: Patent Anatomic variants: None Delayed phase: Normal enhancement on delayed imaging. No enhancing mass lesion. IMPRESSION: No acute intracranial abnormality. Aneurysm clipping right posterior communicating artery aneurysm. No recurrent aneurysm. Atherosclerotic calcification in the cavernous carotid with mild stenosis bilaterally. No significant stenosis of the carotid or vertebral artery in the neck. Electronically Signed   By: CFranchot GalloM.D.   On: 11/30/2015 09:13   X-ray Chest Pa And Lateral  11/28/2015  CLINICAL DATA:  Leukocytosis.  Vertigo. EXAM: CHEST  2 VIEW COMPARISON:  None. FINDINGS: The heart size and mediastinal contours are within normal limits. Both lungs are clear. The visualized skeletal structures are unremarkable. IMPRESSION: No active cardiopulmonary disease. Electronically Signed   By: SFranki CabotM.D.   On: 11/28/2015 18:35   Ct Head Wo Contrast  11/28/2015  CLINICAL DATA:  Acute onset dizziness and vomiting. EXAM: CT HEAD WITHOUT CONTRAST TECHNIQUE: Contiguous axial images were obtained from the base of the skull through the vertex without intravenous contrast. COMPARISON:  None. FINDINGS: Skull and Sinuses:Unremarkable right pterional craniotomy site. No acute osseous finding. No acute sinusitis or mastoiditis. Visualized orbits:  Noted gaze to the left. Brain: No evidence of acute infarction, hemorrhage, hydrocephalus, or mass lesion/mass effect. Sequela of right PCOM region aneurysm clipping. There is superficial gliosis in  the right temporal pole and frontal operculum. Mild for age patchy low-density in the deep cerebral white matter attributed to chronic small vessel disease. IMPRESSION: 1. No acute finding. 2. Prior right aneurysm clipping. 3. Mild chronic small vessel disease. Electronically Signed    By: Monte Fantasia M.D.   On: 11/28/2015 04:13   Ct Angio Neck W/cm &/or Wo/cm  11/30/2015  CLINICAL DATA:  Vertigo and headache.  History aneurysm clipping EXAM: CT ANGIOGRAPHY HEAD AND NECK TECHNIQUE: Multidetector CT imaging of the head and neck was performed using the standard protocol during bolus administration of intravenous contrast. Multiplanar CT image reconstructions and MIPs were obtained to evaluate the vascular anatomy. Carotid stenosis measurements (when applicable) are obtained utilizing NASCET criteria, using the distal internal carotid diameter as the denominator. CONTRAST:  9m OMNIPAQUE IOHEXOL 350 MG/ML SOLN COMPARISON:  CT head 11/28/2015 FINDINGS: CT HEAD Brain: Right pterional craniotomy for aneurysm clipping. Aneurysm clip in the region of the right posterior communicating artery. Negative for subarachnoid hemorrhage. Generalized atrophy without hydrocephalus. Mild chronic ischemic change. No acute infarct or mass. Calvarium and skull base: Right pterional craniotomy changes. No acute bony abnormality Paranasal sinuses:  clear Orbits: Negative CTA NECK Aortic arch: Mild atherosclerotic calcification aortic arch and proximal great vessels. Negative for aneurysm or dissection. Proximal great vessels are patent without significant stenosis. Lung apices clear. Right carotid system: Right common carotid artery widely patent. Mild atherosclerotic plaque at the carotid bifurcation without significant stenosis. Tortuosity right internal carotid artery. Left carotid system: Left common carotid artery widely patent. Minimal atherosclerotic disease at the bifurcation without stenosis. Vertebral arteries:Left vertebral dominant. Both vertebral arteries patent to the basilar without significant stenosis or dissection Skeleton: Cervical spondylosis, moderate. No fracture or acute bony abnormality. Thoracic levoscoliosis and cervical dextroscoliosis. Other neck: Negative for mass or adenopathy in the  neck. CTA HEAD Anterior circulation: Moderate atherosclerotic calcification in the cavernous carotid bilaterally with mild stenosis bilaterally. Right anterior and middle cerebral arteries patent bilaterally without significant stenosis. Aneurysm clip in the region of the right posterior communicating artery. Clip causes streak artifact. Allowing for this, no recurrent aneurysm identified in the area. Posterior circulation: Both vertebral arteries patent to the basilar. PICA patent bilaterally. Basilar patent. Superior cerebellar and posterior cerebral arteries patent bilaterally without significant stenosis. Venous sinuses: Patent Anatomic variants: None Delayed phase: Normal enhancement on delayed imaging. No enhancing mass lesion. IMPRESSION: No acute intracranial abnormality. Aneurysm clipping right posterior communicating artery aneurysm. No recurrent aneurysm. Atherosclerotic calcification in the cavernous carotid with mild stenosis bilaterally. No significant stenosis of the carotid or vertebral artery in the neck. Electronically Signed   By: CFranchot GalloM.D.   On: 11/30/2015 09:13        Filed Weights   11/28/15 0211  Weight: 90.719 kg (200 lb)     Microbiology: No results found for this or any previous visit (from the past 240 hour(s)).     Blood Culture No results found for: SDES, SBoston CULT, REPTSTATUS    Labs: Results for orders placed or performed during the hospital encounter of 11/28/15 (from the past 48 hour(s))  CBC WITH DIFFERENTIAL     Status: Abnormal   Collection Time: 11/28/15  6:00 PM  Result Value Ref Range   WBC 8.4 4.0 - 10.5 K/uL   RBC 4.49 3.87 - 5.11 MIL/uL   Hemoglobin 13.3 12.0 - 15.0 g/dL   HCT 41.9 36.0 - 46.0 %   MCV 93.3 78.0 - 100.0 fL   MCH  29.6 26.0 - 34.0 pg   MCHC 31.7 30.0 - 36.0 g/dL   RDW 14.2 11.5 - 15.5 %   Platelets 206 150 - 400 K/uL   Neutrophils Relative % 90 %   Neutro Abs 7.6 1.7 - 7.7 K/uL   Lymphocytes Relative 10 %    Lymphs Abs 0.8 0.7 - 4.0 K/uL   Monocytes Relative 0 %   Monocytes Absolute 0.0 (L) 0.1 - 1.0 K/uL   Eosinophils Relative 0 %   Eosinophils Absolute 0.0 0.0 - 0.7 K/uL   Basophils Relative 0 %   Basophils Absolute 0.0 0.0 - 0.1 K/uL  Renal function panel     Status: Abnormal   Collection Time: 11/28/15  6:00 PM  Result Value Ref Range   Sodium 140 135 - 145 mmol/L   Potassium 3.8 3.5 - 5.1 mmol/L   Chloride 107 101 - 111 mmol/L   CO2 21 (L) 22 - 32 mmol/L   Glucose, Bld 158 (H) 65 - 99 mg/dL   BUN 26 (H) 6 - 20 mg/dL   Creatinine, Ser 1.60 (H) 0.44 - 1.00 mg/dL   Calcium 9.3 8.9 - 10.3 mg/dL   Phosphorus 2.3 (L) 2.5 - 4.6 mg/dL   Albumin 3.6 3.5 - 5.0 g/dL   GFR calc non Af Amer 31 (L) >60 mL/min   GFR calc Af Amer 36 (L) >60 mL/min    Comment: (NOTE) The eGFR has been calculated using the CKD EPI equation. This calculation has not been validated in all clinical situations. eGFR's persistently <60 mL/min signify possible Chronic Kidney Disease.    Anion gap 12 5 - 15  CBC     Status: None   Collection Time: 11/29/15  5:50 AM  Result Value Ref Range   WBC 7.2 4.0 - 10.5 K/uL   RBC 4.27 3.87 - 5.11 MIL/uL   Hemoglobin 12.6 12.0 - 15.0 g/dL   HCT 39.4 36.0 - 46.0 %   MCV 92.3 78.0 - 100.0 fL   MCH 29.5 26.0 - 34.0 pg   MCHC 32.0 30.0 - 36.0 g/dL   RDW 14.5 11.5 - 15.5 %   Platelets 195 150 - 400 K/uL  Basic metabolic panel     Status: Abnormal   Collection Time: 11/29/15  5:50 AM  Result Value Ref Range   Sodium 141 135 - 145 mmol/L   Potassium 3.7 3.5 - 5.1 mmol/L   Chloride 111 101 - 111 mmol/L   CO2 20 (L) 22 - 32 mmol/L   Glucose, Bld 163 (H) 65 - 99 mg/dL   BUN 26 (H) 6 - 20 mg/dL   Creatinine, Ser 1.47 (H) 0.44 - 1.00 mg/dL   Calcium 8.8 (L) 8.9 - 10.3 mg/dL   GFR calc non Af Amer 34 (L) >60 mL/min   GFR calc Af Amer 40 (L) >60 mL/min    Comment: (NOTE) The eGFR has been calculated using the CKD EPI equation. This calculation has not been validated in all  clinical situations. eGFR's persistently <60 mL/min signify possible Chronic Kidney Disease.    Anion gap 10 5 - 15  Glucose, capillary     Status: Abnormal   Collection Time: 11/29/15  8:01 AM  Result Value Ref Range   Glucose-Capillary 179 (H) 65 - 99 mg/dL  Glucose, capillary     Status: Abnormal   Collection Time: 11/29/15 11:54 AM  Result Value Ref Range   Glucose-Capillary 161 (H) 65 - 99 mg/dL  Glucose, capillary  Status: Abnormal   Collection Time: 11/29/15  5:24 PM  Result Value Ref Range   Glucose-Capillary 156 (H) 65 - 99 mg/dL  Glucose, capillary     Status: Abnormal   Collection Time: 11/29/15 10:07 PM  Result Value Ref Range   Glucose-Capillary 174 (H) 65 - 99 mg/dL  CBC     Status: Abnormal   Collection Time: 11/30/15  6:52 AM  Result Value Ref Range   WBC 13.1 (H) 4.0 - 10.5 K/uL   RBC 3.96 3.87 - 5.11 MIL/uL   Hemoglobin 12.2 12.0 - 15.0 g/dL   HCT 36.6 36.0 - 46.0 %   MCV 92.4 78.0 - 100.0 fL   MCH 30.8 26.0 - 34.0 pg   MCHC 33.3 30.0 - 36.0 g/dL   RDW 14.7 11.5 - 15.5 %   Platelets 197 150 - 400 K/uL  Comprehensive metabolic panel     Status: Abnormal   Collection Time: 11/30/15  6:52 AM  Result Value Ref Range   Sodium 141 135 - 145 mmol/L   Potassium 3.1 (L) 3.5 - 5.1 mmol/L   Chloride 110 101 - 111 mmol/L   CO2 21 (L) 22 - 32 mmol/L   Glucose, Bld 158 (H) 65 - 99 mg/dL   BUN 30 (H) 6 - 20 mg/dL   Creatinine, Ser 1.38 (H) 0.44 - 1.00 mg/dL   Calcium 8.4 (L) 8.9 - 10.3 mg/dL   Total Protein 5.8 (L) 6.5 - 8.1 g/dL   Albumin 3.1 (L) 3.5 - 5.0 g/dL   AST 23 15 - 41 U/L   ALT 14 14 - 54 U/L   Alkaline Phosphatase 39 38 - 126 U/L   Total Bilirubin 0.3 0.3 - 1.2 mg/dL   GFR calc non Af Amer 37 (L) >60 mL/min   GFR calc Af Amer 43 (L) >60 mL/min    Comment: (NOTE) The eGFR has been calculated using the CKD EPI equation. This calculation has not been validated in all clinical situations. eGFR's persistently <60 mL/min signify possible Chronic  Kidney Disease.    Anion gap 10 5 - 15  Glucose, capillary     Status: Abnormal   Collection Time: 11/30/15  7:47 AM  Result Value Ref Range   Glucose-Capillary 147 (H) 65 - 99 mg/dL     Lipid Panel     Component Value Date/Time   CHOL 146 03/22/2015 1004   TRIG 130.0 03/22/2015 1004   HDL 63.70 03/22/2015 1004   CHOLHDL 2 03/22/2015 1004   VLDL 26.0 03/22/2015 1004   LDLCALC 56 03/22/2015 1004   LDLDIRECT 143.6 01/21/2009 0000     Lab Results  Component Value Date   HGBA1C 5.7 03/22/2015   HGBA1C 5.8 10/05/2013   HGBA1C 5.9 10/05/2012     Lab Results  Component Value Date   LDLCALC 56 03/22/2015   CREATININE 1.38* 11/30/2015     75 y.o. female with pmh of hypertension, hyperlipidemia, hyperglycemia and chronic renal insufficiency presented to ED this am with complaints of dizziness. Pt states she felt fine before going to bed last evening around 9:30. She awoke at midnight to let dog out. When she sat up in bed she experienced severe dizziness, n/v. Pt describes dizziness as "world spinning around her". She was unable to get up off of bathroom floor due to dizziness and vomiting prompting husband to call EMS.   On arrival to ED, pt's vertigo persisted. It is now much improved after receiving IV valium. A large amount of  earwax was removed from right ear by EDP. She continues to have nystagmus. Neurology was initially consulted for concern of stroke. Pt unable to have MRI d/t previous aneurysm clipping. Otherwise, labs remarkable for slight elevation in WBC Of 13.2 and Creatinine of 2.07 (baseline ~1.8). Pt denies weakness, slurred speech, facial droop,recent fever, headache, chest pain, sob, adominal pain or dysuria. Plan at this time is to admit for further w/u of vertigo.    Assessment and plan Vertigo -? BPPV vs vestibular neuritis vs other. Will tx for vestibular neuritis per neurology recommendations with Solumedrol 278m q6h, given for 48  hours as per  neurology recommendations  -Will ask PT to evaluate for BPPV, they recommend outpatient  vestibular rehab   CTA of the head and neck  Negative , to assess for CVA (no MRI d/t prior aneurysm clipping) Patient seen by neurology prior to discharge okay to discharge from His standpoint  Acute on chronic renal insufficiency  -no previous w/u done though seems ongoing since 2012. Baseline Cr ~1.8  ACE inhibitor held, creatinine improved 1.38 with IV fluids held ACEI/diuretic indefinitely.   -Consider outpt referral to nephrology Patient advised to discuss with PCP about continuation oflisinopril/HCTZ Follow labs closely  Hypokalemia likely secondary to poor oral intake, HCTZ, repleted   Leukocytosis Possibly secondary to steroids -suspect reactive with recent vomiting. Pt is afebrile, nontoxic appearing, VSS -u/a unremarkable. Negative chest xray -suspect further inc with am labs given high dose of steroids  Hypertension -holding home ACEI/Diuretic given renal insufficiency -Add Norvasc 555m-Monitor  Hyperglycemia -hx of same. A1C 03/2015 5.7%   Discharge Exam:    Blood pressure 149/77, pulse 105, temperature 97.5 F (36.4 C), temperature source Oral, resp. rate 20, height _0  (1.626 m), weight 90.719 kg (200 lb), SpO2 97 %.  General: No acute respiratory distress Lungs: Clear to auscultation bilaterally without wheezes or crackles Cardiovascular: Regular rate and rhythm without murmur gallop or rub normal S1 and S2 Abdomen: Nontender, nondistended, soft, bowel sounds positive, no rebound, no ascites, no appreciable mass Extremities: No significant cyanosis, clubbing, or edema bilateral lower extremities    Follow-up Information    Follow up with ELRenato ShinMD. Schedule an appointment as soon as possible for a visit in 3 days.   Specialty:  Endocrinology   Contact information:   [address] [place] [phone]      Signed: ABReyne Dumas/18/2017, 9:43 AM        Time spent >45 mins

## 2015-11-30 NOTE — Progress Notes (Signed)
NURSING PROGRESS NOTE  Amber Hampton [id number] Discharge Data: 11/30/2015 1:06 PM Attending Provider: No att. providers found [id number], [name], MD   [name] to be D/C'd Home per MD order.    All IV's will be discontinued and monitored for bleeding.  All belongings will be returned to patient for patient to take home.  Last Documented Vital Signs:  Blood pressure 149/77, pulse 105, temperature 97.5 F (36.4 C), temperature source Oral, resp. rate 20, height 5\' 4"  (1.626 m), weight 90.719 kg (200 lb), SpO2 97 %.  Joslyn Hy, MSN, RN, [employer]

## 2015-12-03 ENCOUNTER — Ambulatory Visit
Payer: Commercial Managed Care - HMO | Attending: Internal Medicine | Admitting: Rehabilitative and Restorative Service Providers"

## 2015-12-03 DIAGNOSIS — R2689 Other abnormalities of gait and mobility: Secondary | ICD-10-CM | POA: Insufficient documentation

## 2015-12-03 DIAGNOSIS — R269 Unspecified abnormalities of gait and mobility: Secondary | ICD-10-CM | POA: Diagnosis not present

## 2015-12-03 NOTE — Patient Instructions (Signed)
Gaze Stabilization: Tip Card 1.Target must remain in focus, not blurry, and appear stationary while head is in motion. 2.Perform exercises with small head movements (45 to either side of midline). 3.Increase speed of head motion so long as target is in focus. 4.If you wear eyeglasses, be sure you can see target through lens (therapist will give specific instructions for bifocal / progressive lenses). 5.These exercises may provoke dizziness or nausea. Work through these symptoms. If too dizzy, slow head movement slightly. Rest between each exercise. 6.Exercises demand concentration; avoid distractions. 7.For safety, perform standing exercises close to a counter, wall, corner, or next to someone.  Copyright  VHI. All rights reserved.  Gaze Stabilization: Standing Feet Apart   Feet shoulder width apart, keeping eyes on target on wall 3 feet away, tilt head down slightly and move head side to side for 30 seconds. Repeat while moving head up and down for 30 seconds. Do 2 sessions per day.   Copyright  VHI. All rights reserved.   Feet Apart, Varied Arm Positions - Eyes Closed    Stand with feet shoulder width apart and arms out. Close eyes and visualize upright position. Hold __10__ seconds. Repeat __3__ times per session. Do __2__ sessions per day.  Copyright  VHI. All rights reserved.   Feet Together, Varied Arm Positions - Eyes Open    With eyes open, feet together, arms out, look straight ahead at a stationary object. Hold __30__ seconds. Repeat _3___ times per session. Do _2___ sessions per day.  Copyright  VHI. All rights reserved.

## 2015-12-04 ENCOUNTER — Ambulatory Visit (INDEPENDENT_AMBULATORY_CARE_PROVIDER_SITE_OTHER): Payer: Commercial Managed Care - HMO | Admitting: Endocrinology

## 2015-12-04 ENCOUNTER — Encounter: Payer: Self-pay | Admitting: Endocrinology

## 2015-12-04 VITALS — BP 127/86 | HR 93 | Temp 97.9°F | Ht 63.0 in | Wt 190.0 lb

## 2015-12-04 DIAGNOSIS — N289 Disorder of kidney and ureter, unspecified: Secondary | ICD-10-CM

## 2015-12-04 DIAGNOSIS — R7309 Other abnormal glucose: Secondary | ICD-10-CM | POA: Diagnosis not present

## 2015-12-04 DIAGNOSIS — R739 Hyperglycemia, unspecified: Secondary | ICD-10-CM

## 2015-12-04 LAB — CBC WITH DIFFERENTIAL/PLATELET
BASOS PCT: 0.1 % (ref 0.0–3.0)
Basophils Absolute: 0 10*3/uL (ref 0.0–0.1)
EOS ABS: 0.1 10*3/uL (ref 0.0–0.7)
EOS PCT: 0.6 % (ref 0.0–5.0)
HCT: 40.2 % (ref 36.0–46.0)
HEMOGLOBIN: 13.4 g/dL (ref 12.0–15.0)
LYMPHS ABS: 1.5 10*3/uL (ref 0.7–4.0)
Lymphocytes Relative: 13.9 % (ref 12.0–46.0)
MCHC: 33.3 g/dL (ref 30.0–36.0)
MCV: 90.8 fl (ref 78.0–100.0)
MONO ABS: 0.6 10*3/uL (ref 0.1–1.0)
MONOS PCT: 6 % (ref 3.0–12.0)
NEUTROS ABS: 8.5 10*3/uL — AB (ref 1.4–7.7)
Neutrophils Relative %: 79.4 % — ABNORMAL HIGH (ref 43.0–77.0)
PLATELETS: 191 10*3/uL (ref 150.0–400.0)
RBC: 4.43 Mil/uL (ref 3.87–5.11)
RDW: 14.5 % (ref 11.5–15.5)
WBC: 10.7 10*3/uL — AB (ref 4.0–10.5)

## 2015-12-04 LAB — BASIC METABOLIC PANEL
BUN: 32 mg/dL — AB (ref 6–23)
CHLORIDE: 103 meq/L (ref 96–112)
CO2: 26 mEq/L (ref 19–32)
Calcium: 9.1 mg/dL (ref 8.4–10.5)
Creatinine, Ser: 1.31 mg/dL — ABNORMAL HIGH (ref 0.40–1.20)
GFR: 42.25 mL/min — ABNORMAL LOW (ref >60.00)
GLUCOSE: 92 mg/dL (ref 70–99)
Potassium: 4.1 mEq/L (ref 3.5–5.1)
Sodium: 138 mEq/L (ref 135–145)

## 2015-12-04 LAB — VITAMIN D 25 HYDROXY (VIT D DEFICIENCY, FRACTURES): VITD: 48.31 ng/mL (ref 30.00–100.00)

## 2015-12-04 NOTE — Patient Instructions (Addendum)
Please continue your rehab.  If the symptoms don't further improve, please let me know, so we can send you to an outpatient neurologist. Please stay off the lisinopril-HCTZ for now. Let check an ultrasound of the kidneys.  you will receive a phone call, about a day and time for an appointment.   blood tests are requested for you today.  We'll let you know about the results. good diet and exercise significantly improve your health.  please let me know if you wish to be referred to a dietician.  high blood sugar is very risky to your health.  you should see an eye doctor and dentist every year.  It is very important to get all recommended vaccinations.  please consider these measures for your health:  minimize alcohol.  do not use tobacco products.  have a colonoscopy at least every 10 years from age 46.  Women should have an annual mammogram from age 86.  keep firearms safely stored.  always use seat belts.  have working smoke alarms in your home.  see an eye doctor and dentist regularly.  never drive under the influence of alcohol or drugs (including prescription drugs).  those with fair skin should take precautions against the sun. it is critically important to prevent falling down (keep floor areas well-lit, dry, and free of loose objects.  If you have a cane, walker, or wheelchair, you should use it, even for short trips around the house.  Wear flat-soled shoes.  Also, try not to rush) Please come back for an annual physical appointment in 4 months (must be after 03/21/16)

## 2015-12-04 NOTE — Progress Notes (Signed)
Subjective:    Patient ID: Amber Hampton, female    DOB: 1941/05/12, 75 y.o.   MRN: [id number]  HPI The state of at least three ongoing medical problems is addressed today, with interval history of each noted here: Vertigo: she was recnently in the hospital.  dizziness is improved, but not resolved.  HTN: denies sob Renal insuff: denies dysuria Hyperglycemia: she is finishing her dosepack. Past Medical History  Diagnosis Date  . BREAST CANCER, HX OF 06/18/2007  . HYPERLIPIDEMIA 06/18/2007  . HYPERTENSION 06/18/2007  . ARTHRALGIA 01/12/2008  . HYPERGLYCEMIA 06/18/2007  . ASYMPTOMATIC POSTMENOPAUSAL STATUS 01/19/2008  . TOBACCO USE, QUIT 01/30/2010  . Cancer [facility])     Past Surgical History  Procedure Laterality Date  . Aneurysm right side head  1996    s/p aneurysm clipping (NO MRI)  . Breast lumpectomy    . Cerebral aneurysm repair  1996    clipping (NO MRI)    Social History   Social History  . Marital Status: Married    Spouse Name: N/A  . Number of Children: N/A  . Years of Education: N/A   Occupational History  . Receptionist     Works at [employer]   Social History Main Topics  . Smoking status: Former Smoker    Quit date: 09/14/2006  . Smokeless tobacco: Not on file  . Alcohol Use: No  . Drug Use: No  . Sexual Activity: Not on file   Other Topics Concern  . Not on file   Social History Narrative    Current Outpatient Prescriptions on File Prior to Visit  Medication Sig Dispense Refill  . simvastatin (ZOCOR) 40 MG tablet 1 tab qhs 90 tablet 3  . calcium carbonate (OS-CAL) 600 MG TABS Take 600 mg by mouth daily. Reported on 12/04/2015     No current facility-administered medications on file prior to visit.    No Known Allergies  Family History  Problem Relation Age of Onset  . Cancer Neg Hx     No FH except for pt    BP 127/86 mmHg  Pulse 93  Temp(Src) 97.9 F (36.6 C) (Oral)  Ht 5\' 3"  (1.6 m)  Wt 190 lb (86.183 kg)  BMI 33.67  kg/m2  SpO2 96%  Review of Systems Denies LOC and n/v since d/c    Objective:   Physical Exam VITAL SIGNS:  See vs page GENERAL: no distress LUNGS:  Clear to auscultation HEART:  Regular rate and rhythm without murmurs noted. Normal S1,S2.   Ext: no edema Gait: steady with a walker   Lab Results  Component Value Date   CREATININE 1.31* 12/04/2015   BUN 32* 12/04/2015   NA 138 12/04/2015   K 4.1 12/04/2015   CL 103 12/04/2015   CO2 26 12/04/2015       Assessment & Plan:  Vertigo, improved.  Please see neurol as scheduled. Renal failure: slightly improved--we'll follow Hypokalemia: better: he'll need recheck on lisinipril-HCTZ. HTN: well-controlled off rx: Please stay off the lisinopril-HCTZ for now.  Hyperglycemia: improved.  We'll follow.    Subjective:   Patient here for Medicare annual wellness visit and management of other chronic and acute problems.     Risk factors: advanced age    33 of Physicians Providing Medical Care to Patient:  See "snapshot"   Activities of Daily Living: In your present state of health, do you have any difficulty performing the following activities (lives with husband)?:  Preparing food and eating?:  No  Bathing yourself: No  Getting dressed: No  Using the toilet:No  Moving around from place to place: No  In the past year have you fallen or had a near fall?:No    Home Safety: Has smoke detector and wears seat belts. No firearms. No excess sun exposure.  Diet and Exercise  Current exercise habits: limited by vertigo Dietary issues discussed: pt reports a healthy diet   Depression Screen  Q1: Over the past two weeks, have you felt down, depressed or hopeless? no  Q2: Over the past two weeks, have you felt little interest or pleasure in doing things? no   The following portions of the patient's history were reviewed and updated as appropriate: allergies, current medications, past family history, past medical history, past  social history, past surgical history and problem list.   Review of Systems  Denies hearing loss, and visual loss Objective:   Vision:  Sees optometrist Hearing: grossly normal Body mass index:  See vs page Msk: pt slowly performs "get-up-and-go" from a sitting position Cognitive Impairment Assessment: cognition, memory and judgment appear normal.  remembers 3/3 at 5 minutes.  excellent recall.  can easily read and write a sentence.  alert and oriented x 3   Assessment:   Medicare wellness utd on preventive parameters.     Plan:   During the course of the visit the patient was educated and counseled about appropriate screening and preventive services including:        Fall prevention   Screening mammography  Bone densitometry screening is declined Diabetes screening  Nutrition counseling   Vaccines are up to date  Patient Instructions (the written plan) was given to the patient.

## 2015-12-04 NOTE — Progress Notes (Signed)
we discussed code status.  pt requests full code, but would not want to be started or maintained on artificial life-support measures if there was not a reasonable chance of recovery 

## 2015-12-04 NOTE — Therapy (Signed)
[facility] [address] [place], [place], [address] Phone: [phone]   Fax:  [phone]  Physical Therapy Evaluation  Patient Details  Name: Amber Hampton MRN: [id number] Date of Birth: 03/10/1999 Referring Provider: [name], MD (actual prescription came from hospitalist) f/u to happen with primary care  Encounter Date: 12/03/2015      PT End of Session - 12/03/15 1444    Visit Number 1   Number of Visits 16   Date for PT Re-Evaluation 02/01/16   Authorization Type G code every 10th visit   PT Start Time 0848   PT Stop Time 0930   PT Time Calculation (min) 42 min   Equipment Utilized During Treatment Gait belt   Activity Tolerance Patient tolerated treatment well   Behavior During Therapy [facility] for tasks assessed/performed      Past Medical History  Diagnosis Date  . BREAST CANCER, HX OF 06/18/2007  . HYPERLIPIDEMIA 06/18/2007  . HYPERTENSION 06/18/2007  . ARTHRALGIA 01/12/2008  . HYPERGLYCEMIA 06/18/2007  . ASYMPTOMATIC POSTMENOPAUSAL STATUS 01/19/2008  . TOBACCO USE, QUIT 01/30/2010  . Cancer [facility])     Past Surgical History  Procedure Laterality Date  . Aneurysm right side head  1996    s/p aneurysm clipping (NO MRI)  . Breast lumpectomy    . Cerebral aneurysm repair  1996    clipping (NO MRI)    There were no vitals filed for this visit.  Visit Diagnosis:  Abnormality of gait      Subjective Assessment - 12/03/15 0849    Subjective The patient reports sudden onset of room spinning vertigo 11/27/2015 in which her symptoms lasted constantly x hours, accompanied by nausea/vomiting, imbalance.  She reports "my eyes don't focus".  She had impacted wax in the right ear, and neurologist felt it may be inner ear infection R side vs BPPV- unclear diagnosis in the hospital.     Pertinent History h/o aneurysm with clip 22 years ago.              [facility] PT Assessment - 12/03/15 0855    Assessment    Medical Diagnosis vertigo, abnormality of gait   Referring Provider [name], MD (actual prescription came from hospitalist) f/u to happen with primary care   Onset Date/Surgical Date 11/27/15   Prior Therapy none   Precautions   Precautions Fall   Balance Screen   Has the patient fallen in the past 6 months No   Has the patient had a decrease in activity level because of a fear of falling?  Yes   Is the patient reluctant to leave their home because of a fear of falling?  Yes   [place] Private residence   Living Arrangements Spouse/significant other   Type of [place] to enter   Entrance Stairs-Number of Steps 3   Entrance Stairs-Rails None   Home Layout One level   [place] - 2 wheels   Prior Function   Level of Independence Independent   Vocation Retired   Observation/Other Assessments   Focus on Therapeutic Outcomes (FOTO)  44%   Other Surveys  --  DHI=82%   Ambulation/Gait   Ambulation/Gait Yes   Ambulation/Gait Assistance 6: Modified independent (Device/Increase time)   Ambulation Distance (Feet) 200 Feet   Assistive device Rolling walker   Gait Pattern Wide base of support  limited head movement   Ambulation Surface Level  Gait velocity 1.38 ft/sec   Stairs Yes   Stairs Assistance 6: Modified independent (Device/Increase time)   Stair Management Technique Two rails;Alternating pattern   High Level Balance   High Level Balance Comments Feet together with eyes open with sway x 30 seconds, unable with eyes closed.  STanding feet apart with eyes closed with CGA due to imbalance.            Vestibular Assessment - 12/03/15 0853    Vestibular Assessment   General Observation Patient ambulates into clinic with RW and instability, modified indep.   Symptom Behavior   Type of Dizziness "Funny feeling in head"  "can't focus well", unsteady   Frequency of Dizziness constant sensation   Duration of  Dizziness constant daily   Aggravating Factors Turning head quickly;Turning body quickly  evenings, also feels "indigestion" type sensation   Relieving Factors No known relieving factors   Occulomotor Exam   Occulomotor Alignment Normal   Spontaneous Absent   Gaze-induced R upper quadrant, mild right beat +torsional nystagmus noted in room light   Head shaking Horizontal R beating nystagmus  with frenzels donned/fixation blocked   Smooth Pursuits Intact   Saccades Intact   Comment 3/10 baseline dizziness, wears glasses at all times when up.  Oscillopsia with walking per patient report (environment moving up and down)   Vestibulo-Occular Reflex   VOR 1 Head Only (x 1 viewing) positive for corrective saccade to both directions   Comment head impulse test positive to the L and R sides (L greater refixation saccade than R).  Test of skew negative and no evidence of skew eye deviation.   Visual Acuity   Static 20/40  static vision is diminished per patient report   Dynamic 20/200 with a 4 line difference   Positional Testing   Dix-Hallpike Dix-Hallpike Right;Dix-Hallpike Left   Horizontal Canal Testing Horizontal Canal Right;Horizontal Canal Left   Dix-Hallpike Right   Dix-Hallpike Right Duration no subjective reports   Dix-Hallpike Right Symptoms No nystagmus  in primary gaze, noted mild upbeat/Rrotary with R gaze *patient also noted to have some mild nystagmus in seated with R gaze   Dix-Hallpike Left   Dix-Hallpike Left Duration no subjective reports of vertigo   Dix-Hallpike Left Symptoms --  mild upbeat observed in room light ?more consistent with abnormal visual exam than positional nystagmus   Sidelying Right   Sidelying Right Duration 0   Sidelying Right Symptoms No nystagmus   Sidelying Left   Sidelying Left Duration 0   Sidelying Left Symptoms No nystagmus   Horizontal Canal Right   Horizontal Canal Right Duration 0   Horizontal Canal Right Symptoms Normal   Horizontal  Canal Left   Horizontal Canal Left Duration 0   Horizontal Canal Left Symptoms Normal           Vestibular Treatment/Exercise - 12/03/15 0001    Vestibular Treatment/Exercise   Vestibular Treatment Provided Gaze   Gaze Exercises X1 Viewing Horizontal;X1 Viewing Vertical    NEUROMUSCULAR RE-EDUCATION: Also provided balance HEP in the corner for safety.       PT Education - 12/03/15 1443    Education provided Yes   Education Details HEP: gaze x 1 viewing, standing balance with feet together + eyes closed, feet together with eyes open   Person(s) Educated Patient;Spouse   Methods Explanation;Demonstration;Handout   Comprehension Verbalized understanding;Returned demonstration          PT Short Term Goals - 12/03/15 1444    PT SHORT  TERM GOAL #1   Title The patient will return demo HEP for gaze adaptation, balance and general mobility.   Baseline Target date 01/02/2016   Time 4   Period Weeks   PT SHORT TERM GOAL #2   Title The patient will tolerate gaze x 1 viewing without c/o visual blurring x 60 seconds nonstop at self regulated pace.   Baseline Target date 01/02/2016   Time 4   Period Weeks   PT SHORT TERM GOAL #3   Title The patient will improve balance to demo standing feet together with eyes closed x 30 seconds witihout loss of balance.   Baseline Target date 01/02/2016   Time 4   Period Weeks   PT SHORT TERM GOAL #4   Title The patient will improve gait speed from 1.38 ft/sec to > or equal to 1.8 ft /sec to demo decreased risk for falls.   Baseline Target date 01/02/2016   Time 4   Period Weeks   PT SHORT TERM GOAL #5   Title The patient will ambulate level, indoor surfaces without a device independently x 400 ft.   Baseline Target date 01/02/2016   Time 4   Period Weeks           PT Long Term Goals - 12/03/15 1450    PT LONG TERM GOAL #1   Title The patient will reduce dizziness handicap index from 82% to < or equal to 50% to demo improved self  perception of balance.   Baseline Target date 01/31/2016   Time 8   Period Weeks   PT LONG TERM GOAL #2   Title The patient will improve gait speed from 1.38 ft/sec to > or equal to 2.62 ft/sec to return to "full communityambultor" classification of gait.   Baseline Target date 01/31/2016   Time 8   Period Weeks   PT LONG TERM GOAL #3   Title The patient will improve SVA vs DVA to < 4 line difference (20/40 to 20/100 at eval) to demo improved gaze.   Baseline Target date 01/31/2016   Time 8   Period Weeks   PT LONG TERM GOAL #4   Title The patient will verbalize return to outdoor activities including yard work, walking, etc for return to prior life roles.   Baseline Target date 01/31/2016   Time 8   Period Weeks               Plan - 12/03/15 1453    Clinical Impression Statement The patient is a 75yo female that presents today with h/o sudden onset of vertigo 11/27/2015 with hospital admission.  The patient has clinical presentation consistent with hypofunction L > R side.  She does have some bilateral deficit presentation including oscillopsia and severe imbalance without UE support.  Patient's largest subjective complaint and clinical finding is poor control of vestibular ocular reflex leading to visual blurriness.   At today's evaluation, her symptoms were not consistent with positional vertigo--PT will continue to assess if needed based on response to treatment for hypofunction.   Pt will benefit from skilled therapeutic intervention in order to improve on the following deficits Abnormal gait;Decreased activity tolerance;Decreased balance;Decreased mobility;Decreased strength;Difficulty walking;Dizziness;Postural dysfunction   Rehab Potential Good   PT Frequency 2x / week   PT Duration 8 weeks   PT Treatment/Interventions ADLs/Self Care Home Management;Balance training;Neuromuscular re-education;Therapeutic exercise;Therapeutic activities;Functional mobility training;Gait  training;Patient/family education;Vestibular;Canalith Repostioning   PT Next Visit Plan SOT?, check HEP, gait without device, high level  balance activities, assess Berg components?   Consulted and Agree with Plan of Care Patient;Family member/caregiver   Family Member Consulted spouse          G-Codes - December 31, 2015 0746    Functional Assessment Tool Used Gait speed=1.38 ft/sec; DHI=82%   Functional Limitation Mobility: Walking and moving around   Mobility: Walking and Moving Around Current Status [phone]) At least 40 percent but less than 60 percent impaired, limited or restricted   Mobility: Walking and Moving Around Goal Status [phone]) At least 1 percent but less than 20 percent impaired, limited or restricted       Problem List Patient Active Problem List   Diagnosis Date Noted  . Vertigo 11/28/2015  . Benign positional vertigo   . SAH (subarachnoid hemorrhage) ([place])   . Aneurysm ([place])   . Renal failure   . Uncontrollable vomiting   . Wellness examination 03/22/2015  . UTI (urinary tract infection) 03/22/2015  . Skin nodule 11/06/2013  . Renal insufficiency 10/05/2013  . Encounter for long-term (current) use of other medications 02/19/2011  . TOBACCO USE, QUIT 01/30/2010  . ASYMPTOMATIC POSTMENOPAUSAL STATUS 01/19/2008  . ARTHRALGIA 01/12/2008  . Hyperlipidemia 06/18/2007  . Essential hypertension 06/18/2007  . Hyperglycemia 06/18/2007  . BREAST CANCER, HX OF 06/18/2007   Thank you for the referral of this patient. Rudell Cobb, MPT  [place], PT December 31, 2015, 7:48 AM  [facility] [address] [place], [place], [address] Phone: [phone]   Fax:  [phone]  Name: AERON VIERECK MRN: [id number] Date of Birth: 1940/12/24

## 2015-12-05 LAB — PTH, INTACT AND CALCIUM
Calcium: 8.8 mg/dL (ref 8.4–10.5)
PTH: 90 pg/mL — AB (ref 14–64)

## 2015-12-06 ENCOUNTER — Ambulatory Visit: Payer: Commercial Managed Care - HMO | Admitting: Rehabilitative and Restorative Service Providers"

## 2015-12-06 DIAGNOSIS — R269 Unspecified abnormalities of gait and mobility: Secondary | ICD-10-CM | POA: Diagnosis not present

## 2015-12-06 DIAGNOSIS — R2689 Other abnormalities of gait and mobility: Secondary | ICD-10-CM | POA: Diagnosis not present

## 2015-12-06 NOTE — Therapy (Signed)
[facility] [address] [place], [place], [address] Phone: [phone]   Fax:  [phone]  Physical Therapy Treatment  Patient Details  Name: Amber Hampton MRN: [id number] Date of Birth: 12-10-1940 Referring Provider: [name], MD (actual prescription came from hospitalist) f/u to happen with primary care  Encounter Date: 12/06/2015      PT End of Session - 12/06/15 0848    Visit Number 2   Number of Visits 16   Date for PT Re-Evaluation 02/01/16   Authorization Type G code every 10th visit   PT Start Time 0803   PT Stop Time 0848   PT Time Calculation (min) 45 min   Equipment Utilized During Treatment Gait belt   Activity Tolerance Patient tolerated treatment well   Behavior During Therapy [facility] for tasks assessed/performed      Past Medical History  Diagnosis Date  . BREAST CANCER, HX OF 06/18/2007  . HYPERLIPIDEMIA 06/18/2007  . HYPERTENSION 06/18/2007  . ARTHRALGIA 01/12/2008  . HYPERGLYCEMIA 06/18/2007  . ASYMPTOMATIC POSTMENOPAUSAL STATUS 01/19/2008  . TOBACCO USE, QUIT 01/30/2010  . Cancer [facility])     Past Surgical History  Procedure Laterality Date  . Aneurysm right side head  1996    s/p aneurysm clipping (NO MRI)  . Breast lumpectomy    . Cerebral aneurysm repair  1996    clipping (NO MRI)    There were no vitals filed for this visit.  Visit Diagnosis:  Other abnormalities of gait and mobility      Subjective Assessment - 12/06/15 0802    Subjective Patient reports symptoms are easing up some.  She arrived without her walker today.  She is doing HEP.    Patient Stated Goals improve balance   Currently in Pain? No/denies           [facility] Adult PT Treatment/Exercise - 12/06/15 0852    Ambulation/Gait   Ambulation/Gait Yes   Ambulation/Gait Assistance 4: Min guard   Ambulation Distance (Feet) 400 Feet   Assistive device None   Gait Comments PT worked on gait with tactile cues for arm  swing and verbal cues for longer stride length.  PT discussed visual spotting to commpensate for imbalance when walking.   Standardized Balance Assessment   Standardized Balance Assessment Balance Master Testing   High Level Balance   High Level Balance Comments Sensory organization test scoring 33% compared to age/height norms of 65%.  Patient moderately dec'd in use of somatosensory and visual feedback and severely diminished use of vestibular feedback. Patient maintains weight at anterior limit of stability.   Self-Care   Self-Care Other Self-Care Comments   Other Self-Care Comments  Educated re: sensory organization testing results and functional transfer (avoiding unlevel surfaces at night).   Neuro Re-ed    Neuro Re-ed Details  Balance activities in corner including partial heel/toe, feet apart on compliant surfaces +head motion, compliant surfaces with eyes closed, standing feet together + head motion, gaze x 1 viewing standing with UE support, wall bumps for limits of stability, seated eye/hand coordination with ball toss.  Patient required CGA with occasional min A during standing tasks.                PT Education - 12/06/15 0841    Education provided Yes   Education Details HEP: gaze in standing, partial heel toe, foam with eyes closed, feet together with head turns.   Person(s) Educated Patient;Spouse   Methods Demonstration;Explanation;Handout   Comprehension  Returned demonstration;Verbalized understanding          PT Short Term Goals - 12/03/15 1444    PT SHORT TERM GOAL #1   Title The patient will return demo HEP for gaze adaptation, balance and general mobility.   Baseline Target date 01/02/2016   Time 4   Period Weeks   PT SHORT TERM GOAL #2   Title The patient will tolerate gaze x 1 viewing without c/o visual blurring x 60 seconds nonstop at self regulated pace.   Baseline Target date 01/02/2016   Time 4   Period Weeks   PT SHORT TERM GOAL #3   Title The  patient will improve balance to demo standing feet together with eyes closed x 30 seconds witihout loss of balance.   Baseline Target date 01/02/2016   Time 4   Period Weeks   PT SHORT TERM GOAL #4   Title The patient will improve gait speed from 1.38 ft/sec to > or equal to 1.8 ft /sec to demo decreased risk for falls.   Baseline Target date 01/02/2016   Time 4   Period Weeks   PT SHORT TERM GOAL #5   Title The patient will ambulate level, indoor surfaces without a device independently x 400 ft.   Baseline Target date 01/02/2016   Time 4   Period Weeks           PT Long Term Goals - 12/03/15 1450    PT LONG TERM GOAL #1   Title The patient will reduce dizziness handicap index from 82% to < or equal to 50% to demo improved self perception of balance.   Baseline Target date 01/31/2016   Time 8   Period Weeks   PT LONG TERM GOAL #2   Title The patient will improve gait speed from 1.38 ft/sec to > or equal to 2.62 ft/sec to return to "full communityambultor" classification of gait.   Baseline Target date 01/31/2016   Time 8   Period Weeks   PT LONG TERM GOAL #3   Title The patient will improve SVA vs DVA to < 4 line difference (20/40 to 20/100 at eval) to demo improved gaze.   Baseline Target date 01/31/2016   Time 8   Period Weeks   PT LONG TERM GOAL #4   Title The patient will verbalize return to outdoor activities including yard work, walking, etc for return to prior life roles.   Baseline Target date 01/31/2016   Time 8   Period Weeks               Plan - 12/06/15 [id number]    Clinical Impression Statement The patient demonstrated learing between trials on sensory organization testing demonstrating central compensation/adaptation.  PT progressed HEP to improve challenge and discussed safety for HEP.   PT Next Visit Plan Check HEP, balance, dynamic gait, progress HEP   Consulted and Agree with Plan of Care Patient;Family member/caregiver   Family Member Consulted spouse         Problem List Patient Active Problem List   Diagnosis Date Noted  . Hypocalcemia 12/04/2015  . Vertigo 11/28/2015  . Benign positional vertigo   . SAH (subarachnoid hemorrhage) ([place])   . Aneurysm ([place])   . Renal failure   . Uncontrollable vomiting   . Wellness examination 03/22/2015  . UTI (urinary tract infection) 03/22/2015  . Skin nodule 11/06/2013  . Renal insufficiency 10/05/2013  . Encounter for long-term (current) use of other medications 02/19/2011  .  TOBACCO USE, QUIT 01/30/2010  . ASYMPTOMATIC POSTMENOPAUSAL STATUS 01/19/2008  . ARTHRALGIA 01/12/2008  . Hyperlipidemia 06/18/2007  . Essential hypertension 06/18/2007  . Hyperglycemia 06/18/2007  . BREAST CANCER, HX OF 06/18/2007    Kadien Lineman, PT 12/06/2015, 9:45 AM  [facility] [address] [place], [place], [address] Phone: [phone]   Fax:  [phone]  Name: Amber Hampton MRN: [id number] Date of Birth: [birth year]

## 2015-12-06 NOTE — Patient Instructions (Signed)
Feet Partial Heel-Toe, Varied Arm Positions - Eyes Open    With eyes open, right foot partially in front of the other, arms at your side, look straight ahead at a stationary object. Hold __30__ seconds, then switch feet. Repeat __3__ times with each foot forward per session. Do __2__ sessions per day.  Copyright  VHI. All rights reserved.   Feet Together, Head Motion - Eyes Open    With eyes open, feet together, move head slowly: side to side 10 times.  Do _2___ sessions per day.  Copyright  VHI. All rights reserved.  Feet Apart (Compliant Surface) Varied Arm Positions - Eyes Closed    Stand on compliant surface: __pillow______ with feet shoulder width apart and arms at your side. Close eyes and visualize upright position. Hold__10__ seconds. Repeat _3-5___ times per session. Do __2__ sessions per day.  Copyright  VHI. All rights reserved.   Gaze Stabilization: Tip Card 1.Target must remain in focus, not blurry, and appear stationary while head is in motion. 2.Perform exercises with small head movements (45 to either side of midline). 3.Increase speed of head motion so long as target is in focus. 4.If you wear eyeglasses, be sure you can see target through lens (therapist will give specific instructions for bifocal / progressive lenses). 5.These exercises may provoke dizziness or nausea. Work through these symptoms. If too dizzy, slow head movement slightly. Rest between each exercise. 6.Exercises demand concentration; avoid distractions. 7.For safety, perform standing exercises close to a counter, wall, corner, or next to someone.  Copyright  VHI. All rights reserved.  Gaze Stabilization: Standing Feet Apart   Feet shoulder width apart, keeping eyes on target on wall 3 feet away, tilt head down slightly and move head side to side for 30 seconds.  Do 2 sessions per day.   Copyright  VHI. All rights reserved.

## 2015-12-10 ENCOUNTER — Ambulatory Visit: Payer: Commercial Managed Care - HMO | Admitting: Rehabilitative and Restorative Service Providers"

## 2015-12-10 DIAGNOSIS — R2689 Other abnormalities of gait and mobility: Secondary | ICD-10-CM | POA: Diagnosis not present

## 2015-12-10 DIAGNOSIS — R269 Unspecified abnormalities of gait and mobility: Secondary | ICD-10-CM | POA: Diagnosis not present

## 2015-12-10 NOTE — Therapy (Signed)
[facility] [address] [place], [place], [address] Phone: [phone]   Fax:  [phone]  Physical Therapy Treatment  Patient Details  Name: Amber Hampton MRN: [id number] Date of Birth: [birth year] Referring Provider: [name], MD (actual prescription came from hospitalist) f/u to happen with primary care  Encounter Date: 12/10/2015      PT End of Session - 12/10/15 0925    Visit Number 3   Number of Visits 16   Date for PT Re-Evaluation 02/01/16   Authorization Type G code every 10th visit   PT Start Time 0846   PT Stop Time 0928   PT Time Calculation (min) 42 min   Equipment Utilized During Treatment Gait belt   Activity Tolerance Patient tolerated treatment well   Behavior During Therapy [facility] for tasks assessed/performed      Past Medical History  Diagnosis Date  . BREAST CANCER, HX OF 06/18/2007  . HYPERLIPIDEMIA 06/18/2007  . HYPERTENSION 06/18/2007  . ARTHRALGIA 01/12/2008  . HYPERGLYCEMIA 06/18/2007  . ASYMPTOMATIC POSTMENOPAUSAL STATUS 01/19/2008  . TOBACCO USE, QUIT 01/30/2010  . Cancer [facility])     Past Surgical History  Procedure Laterality Date  . Aneurysm right side head  1996    s/p aneurysm clipping (NO MRI)  . Breast lumpectomy    . Cerebral aneurysm repair  1996    clipping (NO MRI)    There were no vitals filed for this visit.  Visit Diagnosis:  Other abnormalities of gait and mobility      Subjective Assessment - 12/10/15 0850    Subjective The patient reports she has improved with exercises and overall mobility.  The most difficult environment is eyes closed with activities.    Patient Stated Goals improve balance   Currently in Pain? No/denies                         [facility] Adult PT Treatment/Exercise - 12/10/15 1403    Ambulation/Gait   Ambulation/Gait Yes   Ambulation/Gait Assistance 5: Supervision   Ambulation Distance (Feet) 300 Feet   Assistive device  None   Ambulation Surface Level   Gait Comments Patient performed ambulation with horiozntal head turns spotting objects with CGA for safety, ball toss to self and with +1 assistance with reaching during gait activities, 180 degree turns during gait with CGA.    High Level Balance   High Level Balance Activities Side stepping;Head turns   High Level Balance Comments Patient performed sidestepping on compliant surface and marching in place on compliant surfaces with CGA for safety.  She performed steady state balance with horizontal head turns, standing balance on compliant rocker board with UE movements adding head motion with min A.  Standing R<>L trunk rotation + head rotation reaching for target with cues to avoid anterior leaning/loss of balance.     Neuro Re-ed    Neuro Re-ed Details  Balance activities in corner working on partial heel/toe with eyes closed progressing HEP.  Worked on limits of stability with cues finding posterior limit of stability and standing on ramp with assistance for further challenge.          Vestibular Treatment/Exercise - 12/10/15 1409    Vestibular Treatment/Exercise   Vestibular Treatment Provided Gaze   Gaze Exercises X1 Viewing Horizontal   X1 Viewing Horizontal   Foot Position standing feet apart   Comments patient needs cues to avoid stopping in the middle during head motion.  PT Education - 12/10/15 1400    Education provided Yes   Education Details Reviewed VOR exercise, added eyes closed to partial heel<>toe   Person(s) Educated Patient   Methods Explanation;Demonstration;Handout   Comprehension Verbalized understanding;Returned demonstration          PT Short Term Goals - 12/03/15 1444    PT SHORT TERM GOAL #1   Title The patient will return demo HEP for gaze adaptation, balance and general mobility.   Baseline Target date 01/02/2016   Time 4   Period Weeks   PT SHORT TERM GOAL #2   Title The patient will tolerate  gaze x 1 viewing without c/o visual blurring x 60 seconds nonstop at self regulated pace.   Baseline Target date 01/02/2016   Time 4   Period Weeks   PT SHORT TERM GOAL #3   Title The patient will improve balance to demo standing feet together with eyes closed x 30 seconds witihout loss of balance.   Baseline Target date 01/02/2016   Time 4   Period Weeks   PT SHORT TERM GOAL #4   Title The patient will improve gait speed from 1.38 ft/sec to > or equal to 1.8 ft /sec to demo decreased risk for falls.   Baseline Target date 01/02/2016   Time 4   Period Weeks   PT SHORT TERM GOAL #5   Title The patient will ambulate level, indoor surfaces without a device independently x 400 ft.   Baseline Target date 01/02/2016   Time 4   Period Weeks           PT Long Term Goals - 12/03/15 1450    PT LONG TERM GOAL #1   Title The patient will reduce dizziness handicap index from 82% to < or equal to 50% to demo improved self perception of balance.   Baseline Target date 01/31/2016   Time 8   Period Weeks   PT LONG TERM GOAL #2   Title The patient will improve gait speed from 1.38 ft/sec to > or equal to 2.62 ft/sec to return to "full communityambultor" classification of gait.   Baseline Target date 01/31/2016   Time 8   Period Weeks   PT LONG TERM GOAL #3   Title The patient will improve SVA vs DVA to < 4 line difference (20/40 to 20/100 at eval) to demo improved gaze.   Baseline Target date 01/31/2016   Time 8   Period Weeks   PT LONG TERM GOAL #4   Title The patient will verbalize return to outdoor activities including yard work, walking, etc for return to prior life roles.   Baseline Target date 01/31/2016   Time 8   Period Weeks               Plan - 12/10/15 1402    Clinical Impression Statement The patient is improving with gait on level surfaces.  Multi-sensory environments are still challenging for patient to perform with her reporting difficulty with eyes closed.  Continue to  STGs and LTGs working on patient comfort with dynamic balance/gait activities.   PT Next Visit Plan Check HEP, balance, dynamic gait, progress HEP   Consulted and Agree with Plan of Care Patient;Family member/caregiver   Family Member Consulted spouse        Problem List Patient Active Problem List   Diagnosis Date Noted  . Hypocalcemia 12/04/2015  . Vertigo 11/28/2015  . Benign positional vertigo   . SAH (subarachnoid hemorrhage) ([place])   .  Aneurysm ([place])   . Renal failure   . Uncontrollable vomiting   . Wellness examination 03/22/2015  . UTI (urinary tract infection) 03/22/2015  . Skin nodule 11/06/2013  . Renal insufficiency 10/05/2013  . Encounter for long-term (current) use of other medications 02/19/2011  . TOBACCO USE, QUIT 01/30/2010  . ASYMPTOMATIC POSTMENOPAUSAL STATUS 01/19/2008  . ARTHRALGIA 01/12/2008  . Hyperlipidemia 06/18/2007  . Essential hypertension 06/18/2007  . Hyperglycemia 06/18/2007  . BREAST CANCER, HX OF 06/18/2007    Brooklyn Alfredo, PT 12/10/2015, 2:09 PM  [facility] [address] [place], [place], [address] Phone: [phone]   Fax:  [phone]  Name: Amber Hampton MRN: [id number] Date of Birth: 08-20-1941

## 2015-12-10 NOTE — Patient Instructions (Addendum)
FOR LETTER EXERCISE: Remember not to stop in the middle.  Let your head move about 30 degrees to each side without stopping.   Feet Partial Heel-Toe, Varied Arm Positions - Eyes Closed    Stand with right foot partially in front of the other and arms out. Close eyes and visualize upright position. Hold __20-30__ seconds. Repeat __3__ times with each foot forward per session. Do __2__ sessions per day.  Copyright  VHI. All rights reserved.

## 2015-12-11 ENCOUNTER — Encounter: Payer: Commercial Managed Care - HMO | Admitting: Rehabilitative and Restorative Service Providers"

## 2015-12-16 ENCOUNTER — Ambulatory Visit
Payer: Commercial Managed Care - HMO | Attending: Internal Medicine | Admitting: Rehabilitative and Restorative Service Providers"

## 2015-12-16 ENCOUNTER — Telehealth: Payer: Self-pay | Admitting: Endocrinology

## 2015-12-16 VITALS — BP 178/98 | HR 74

## 2015-12-16 DIAGNOSIS — R42 Dizziness and giddiness: Secondary | ICD-10-CM | POA: Insufficient documentation

## 2015-12-16 DIAGNOSIS — R270 Ataxia, unspecified: Secondary | ICD-10-CM | POA: Insufficient documentation

## 2015-12-16 DIAGNOSIS — R2689 Other abnormalities of gait and mobility: Secondary | ICD-10-CM | POA: Diagnosis not present

## 2015-12-16 DIAGNOSIS — H933X9 Disorders of unspecified acoustic nerve: Secondary | ICD-10-CM | POA: Insufficient documentation

## 2015-12-16 MED ORDER — AMLODIPINE BESYLATE 5 MG PO TABS: 5.0000 mg | ORAL_TABLET | Freq: Every day | ORAL | Status: DC

## 2015-12-16 NOTE — Telephone Encounter (Signed)
See note below and please advise, Thanks! 

## 2015-12-16 NOTE — Telephone Encounter (Signed)
Pt advised of note below and voiced understanding.  

## 2015-12-16 NOTE — Therapy (Signed)
[facility] [address] [place], [place], [address] Phone: [phone]   Fax:  [phone]  Physical Therapy Treatment  Patient Details  Name: Amber Hampton MRN: [id number] Date of Birth: 13-Apr-1941 Referring Provider: [name], MD (actual prescription came from hospitalist) f/u to happen with primary care  Encounter Date: 12/16/2015      PT End of Session - 12/16/15 [id number]    Visit Number 4   Number of Visits 16   Date for PT Re-Evaluation 02/01/16   Authorization Type G code every 10th visit   PT Start Time 0852   PT Stop Time 0910   PT Time Calculation (min) 18 min   Activity Tolerance Treatment limited secondary to medical complications (Comment)  elevated BP   Behavior During Therapy [facility] for tasks assessed/performed      Past Medical History  Diagnosis Date  . BREAST CANCER, HX OF 06/18/2007  . HYPERLIPIDEMIA 06/18/2007  . HYPERTENSION 06/18/2007  . ARTHRALGIA 01/12/2008  . HYPERGLYCEMIA 06/18/2007  . ASYMPTOMATIC POSTMENOPAUSAL STATUS 01/19/2008  . TOBACCO USE, QUIT 01/30/2010  . Cancer [facility])     Past Surgical History  Procedure Laterality Date  . Aneurysm right side head  1996    s/p aneurysm clipping (NO MRI)  . Breast lumpectomy    . Cerebral aneurysm repair  1996    clipping (NO MRI)    Filed Vitals:   12/16/15 0900 12/16/15 0901  BP: 179/96 178/98  Pulse: 74     Visit Diagnosis:  Other abnormalities of gait and mobility      Subjective Assessment - 12/16/15 0855    Subjective The patient feels she is doing significantly better.  Turns are the hardest activity in day to day tasks.   The patient reports she still has not driven.   The patient started with headaches 2 days ago on the right side of her head.  Headaches are intermittent in nature (took ibuprofen 3 times yesterday).     Patient Stated Goals improve balance   Currently in Pain? No/denies  has been getting headaches on the right  side             [facility] Adult PT Treatment/Exercise - 12/16/15 0919    Self-Care   Self-Care Other Self-Care Comments   Other Self-Care Comments  Discussed elevated BP and factors that would make it necessary to seek emergency medical attention including worsening headaches, vision changes, weakness, speech issues.  Patient plans to call MD office when she returns home as she has BP medications that she recently stopped taking.  PT educated patient to wait until she hears from MD office as medications are not in PT scope of practice.          PT Education - 12/16/15 [phone]    Education provided Yes   Education Details educated on elevated BP, need to f/u with primary care MD today for appointment.   Person(s) Educated Patient;Spouse   Methods Explanation   Comprehension Verbalized understanding          PT Short Term Goals - 12/03/15 1444    PT SHORT TERM GOAL #1   Title The patient will return demo HEP for gaze adaptation, balance and general mobility.   Baseline Target date 01/02/2016   Time 4   Period Weeks   PT SHORT TERM GOAL #2   Title The patient will tolerate gaze x 1 viewing without c/o visual blurring x 60 seconds nonstop at self regulated  pace.   Baseline Target date 01/02/2016   Time 4   Period Weeks   PT SHORT TERM GOAL #3   Title The patient will improve balance to demo standing feet together with eyes closed x 30 seconds witihout loss of balance.   Baseline Target date 01/02/2016   Time 4   Period Weeks   PT SHORT TERM GOAL #4   Title The patient will improve gait speed from 1.38 ft/sec to > or equal to 1.8 ft /sec to demo decreased risk for falls.   Baseline Target date 01/02/2016   Time 4   Period Weeks   PT SHORT TERM GOAL #5   Title The patient will ambulate level, indoor surfaces without a device independently x 400 ft.   Baseline Target date 01/02/2016   Time 4   Period Weeks           PT Long Term Goals - 12/03/15 1450    PT LONG TERM GOAL #1    Title The patient will reduce dizziness handicap index from 82% to < or equal to 50% to demo improved self perception of balance.   Baseline Target date 01/31/2016   Time 8   Period Weeks   PT LONG TERM GOAL #2   Title The patient will improve gait speed from 1.38 ft/sec to > or equal to 2.62 ft/sec to return to "full communityambultor" classification of gait.   Baseline Target date 01/31/2016   Time 8   Period Weeks   PT LONG TERM GOAL #3   Title The patient will improve SVA vs DVA to < 4 line difference (20/40 to 20/100 at eval) to demo improved gaze.   Baseline Target date 01/31/2016   Time 8   Period Weeks   PT LONG TERM GOAL #4   Title The patient will verbalize return to outdoor activities including yard work, walking, etc for return to prior life roles.   Baseline Target date 01/31/2016   Time 8   Period Weeks               Plan - 12/16/15 [id number]    Clinical Impression Statement PT provided patient education today re: need to f/u with MD for BP.  Recommended patient hold on exercises today.   PT Next Visit Plan Monitor BP, Check HEP, balance, dynamic gait, progress HEP   Consulted and Agree with Plan of Care Patient;Family member/caregiver   Family Member Consulted spouse        Problem List Patient Active Problem List   Diagnosis Date Noted  . Eight cranial nerve disorder   . Ataxia   . Hypocalcemia 12/04/2015  . Vertigo 11/28/2015  . Benign positional vertigo   . SAH (subarachnoid hemorrhage) ([place])   . Aneurysm ([place])   . Renal failure   . Uncontrollable vomiting   . Wellness examination 03/22/2015  . UTI (urinary tract infection) 03/22/2015  . Skin nodule 11/06/2013  . Renal insufficiency 10/05/2013  . Encounter for long-term (current) use of other medications 02/19/2011  . TOBACCO USE, QUIT 01/30/2010  . ASYMPTOMATIC POSTMENOPAUSAL STATUS 01/19/2008  . ARTHRALGIA 01/12/2008  . Hyperlipidemia 06/18/2007  . Essential hypertension 06/18/2007  .  Hyperglycemia 06/18/2007  . BREAST CANCER, HX OF 06/18/2007    Myeisha Kruser, PT 12/16/2015, 9:25 AM  [facility] [address] [place], [place], [address] Phone: [phone]   Fax:  [phone]  Name: Amber Hampton MRN: [id number] Date of Birth: [birth year]

## 2015-12-16 NOTE — Telephone Encounter (Signed)
Ok, i have sent a prescription to your pharmacy. Please come back for a blood pressure check in 2 weeks

## 2015-12-16 NOTE — Telephone Encounter (Signed)
Pt has not been able to do her speech therapy because her Blood Pressure has been high 178/98 please advise if pt should go back on meds

## 2015-12-18 ENCOUNTER — Encounter: Payer: Commercial Managed Care - HMO | Admitting: Rehabilitative and Restorative Service Providers"

## 2015-12-19 NOTE — Addendum Note (Signed)
Addended by: Rudell Cobb M on: 12/19/2015 02:54 PM   Modules accepted: Orders

## 2015-12-25 ENCOUNTER — Encounter: Payer: Commercial Managed Care - HMO | Admitting: Rehabilitative and Restorative Service Providers"

## 2016-01-01 ENCOUNTER — Encounter: Payer: Commercial Managed Care - HMO | Admitting: Rehabilitative and Restorative Service Providers"

## 2016-01-02 ENCOUNTER — Encounter: Payer: Self-pay | Admitting: Rehabilitative and Restorative Service Providers"

## 2016-01-02 NOTE — Therapy (Signed)
[facility] [address] [place], [place], [address] Phone: [phone]   Fax:  [phone]  Patient Details  Name: Amber Hampton MRN: [id number] Date of Birth: 13-Dec-1940 Referring Provider:  No ref. provider found  Encounter Date: last encounter 12/16/2015  PHYSICAL THERAPY DISCHARGE SUMMARY  Visits from Start of Care: 4  Current functional level related to goals / functional outcomes: PT did not check goals as patient called to cancel remaining visits due to significant improvements in mobility.   Remaining deficits: See eval as patient did not return   Education / Equipment: HEP  Plan: Patient agrees to discharge.  Patient goals were not met. Patient is being discharged due to not returning since the last visit.  ?????Cancelled visits due to symptoms improving.         Thank you for the referral of this patient. Rudell Cobb, MPT   Amber Hampton 01/02/2016, 1:57 PM  [facility] [address] [place], [place], [address] Phone: [phone]   Fax:  [phone]

## 2016-01-16 DIAGNOSIS — H5210 Myopia, unspecified eye: Secondary | ICD-10-CM | POA: Diagnosis not present

## 2016-01-16 DIAGNOSIS — H5213 Myopia, bilateral: Secondary | ICD-10-CM | POA: Diagnosis not present

## 2016-03-23 ENCOUNTER — Ambulatory Visit (INDEPENDENT_AMBULATORY_CARE_PROVIDER_SITE_OTHER): Payer: Commercial Managed Care - HMO | Admitting: Endocrinology

## 2016-03-23 ENCOUNTER — Telehealth: Payer: Self-pay

## 2016-03-23 ENCOUNTER — Encounter: Payer: Self-pay | Admitting: Endocrinology

## 2016-03-23 VITALS — BP 150/90 | HR 84 | Wt 185.8 lb

## 2016-03-23 DIAGNOSIS — E785 Hyperlipidemia, unspecified: Secondary | ICD-10-CM | POA: Diagnosis not present

## 2016-03-23 DIAGNOSIS — N19 Unspecified kidney failure: Secondary | ICD-10-CM

## 2016-03-23 LAB — BASIC METABOLIC PANEL
BUN: 32 mg/dL — ABNORMAL HIGH (ref 6–23)
CALCIUM: 10.1 mg/dL (ref 8.4–10.5)
CHLORIDE: 106 meq/L (ref 96–112)
CO2: 24 meq/L (ref 19–32)
CREATININE: 1.45 mg/dL — AB (ref 0.40–1.20)
GFR: 37.55 mL/min — ABNORMAL LOW (ref >60.00)
GLUCOSE: 94 mg/dL (ref 70–99)
Potassium: 4.3 mEq/L (ref 3.5–5.1)
SODIUM: 139 meq/L (ref 135–145)

## 2016-03-23 NOTE — Progress Notes (Signed)
   Subjective:    Patient ID: [name], female    DOB: 02/11/1941, 75 y.o.   MRN: [id number]  HPI The state of at least three ongoing medical problems is addressed today, with interval history of each noted here: Vertigo: dizziness continues to lessen HTN: denies sob.  Renal insuff: denies dysuria.   Past Medical History  Diagnosis Date  . BREAST CANCER, HX OF 06/18/2007  . HYPERLIPIDEMIA 06/18/2007  . HYPERTENSION 06/18/2007  . ARTHRALGIA 01/12/2008  . HYPERGLYCEMIA 06/18/2007  . ASYMPTOMATIC POSTMENOPAUSAL STATUS 01/19/2008  . TOBACCO USE, QUIT 01/30/2010  . Cancer [facility])     Past Surgical History  Procedure Laterality Date  . Aneurysm right side head  1996    s/p aneurysm clipping (NO MRI)  . Breast lumpectomy    . Cerebral aneurysm repair  1996    clipping (NO MRI)    Social History   Social History  . Marital Status: Married    Spouse Name: N/A  . Number of Children: N/A  . Years of Education: N/A   Occupational History  . Receptionist     Works at [employer]   Social History Main Topics  . Smoking status: Former Smoker    Quit date: 09/14/2006  . Smokeless tobacco: Not on file  . Alcohol Use: No  . Drug Use: No  . Sexual Activity: Not on file   Other Topics Concern  . Not on file   Social History Narrative    Current Outpatient Prescriptions on File Prior to Visit  Medication Sig Dispense Refill  . amLODipine (NORVASC) 5 MG tablet Take 1 tablet (5 mg total) by mouth daily. 30 tablet 1  . calcium carbonate (OS-CAL) 600 MG TABS Take 600 mg by mouth daily. Reported on 12/04/2015     No current facility-administered medications on file prior to visit.    No Known Allergies  Family History  Problem Relation Age of Onset  . Cancer Neg Hx     No FH except for pt    BP 150/90 mmHg  Pulse 84  Wt 185 lb 12.8 oz (84.278 kg)  SpO2 97%  Review of Systems Denies edema and LOC    Objective:   Physical Exam VITAL SIGNS:  See vs page.    GENERAL: no distress. Ext: no edema.    Lab Results  Component Value Date   CREATININE 1.45* 03/23/2016   BUN 32* 03/23/2016   NA 139 03/23/2016   K 4.3 03/23/2016   CL 106 03/23/2016   CO2 24 03/23/2016      Assessment & Plan:  Renal failure: stable; we'll follow HTN: borderline control Vertigo: improved.  Pt declines appt with neurol.    Patient is advised the following: Patient Instructions  blood tests are requested for you today.  We'll let you know about the results.  As your blood pressure is just borderline today, please continue the same medication for now.   Please come back for an annual physical appointment in 2 months.    [name], MD

## 2016-03-23 NOTE — Telephone Encounter (Signed)
Called and spoke to patient about lab results, Patient had no questions or concerns.

## 2016-03-23 NOTE — Patient Instructions (Addendum)
blood tests are requested for you today.  We'll let you know about the results.  As your blood pressure is just borderline today, please continue the same medication for now.   Please come back for an annual physical appointment in 2 months.

## 2016-03-24 ENCOUNTER — Other Ambulatory Visit: Payer: Self-pay | Admitting: Endocrinology

## 2016-04-07 ENCOUNTER — Encounter: Payer: Commercial Managed Care - HMO | Admitting: Endocrinology

## 2016-05-25 ENCOUNTER — Ambulatory Visit: Payer: Commercial Managed Care - HMO | Admitting: Endocrinology

## 2016-07-21 ENCOUNTER — Other Ambulatory Visit: Payer: Self-pay | Admitting: Endocrinology

## 2016-07-21 DIAGNOSIS — Z1231 Encounter for screening mammogram for malignant neoplasm of breast: Secondary | ICD-10-CM

## 2016-07-31 ENCOUNTER — Ambulatory Visit
Admission: RE | Admit: 2016-07-31 | Discharge: 2016-07-31 | Disposition: A | Discharge status: Home / Self Care | Payer: Commercial Managed Care - HMO | Source: Ambulatory Visit | Attending: Endocrinology | Admitting: Endocrinology

## 2016-07-31 DIAGNOSIS — Z1231 Encounter for screening mammogram for malignant neoplasm of breast: Secondary | ICD-10-CM

## 2016-09-28 ENCOUNTER — Other Ambulatory Visit: Payer: Self-pay | Admitting: Endocrinology

## 2016-09-28 NOTE — Telephone Encounter (Signed)
Please refill x 1 Ov is due  

## 2016-10-12 ENCOUNTER — Other Ambulatory Visit: Payer: Self-pay | Admitting: Endocrinology

## 2016-10-21 ENCOUNTER — Other Ambulatory Visit: Payer: Self-pay | Admitting: Endocrinology

## 2016-11-13 ENCOUNTER — Other Ambulatory Visit: Payer: Self-pay | Admitting: Endocrinology

## 2017-04-01 ENCOUNTER — Other Ambulatory Visit: Payer: Self-pay | Admitting: Endocrinology

## 2017-04-07 DIAGNOSIS — H5213 Myopia, bilateral: Secondary | ICD-10-CM | POA: Diagnosis not present

## 2017-04-09 ENCOUNTER — Other Ambulatory Visit: Payer: Self-pay | Admitting: Endocrinology

## 2017-06-22 DIAGNOSIS — H2512 Age-related nuclear cataract, left eye: Secondary | ICD-10-CM | POA: Diagnosis not present

## 2017-06-22 DIAGNOSIS — H25013 Cortical age-related cataract, bilateral: Secondary | ICD-10-CM | POA: Diagnosis not present

## 2017-06-22 DIAGNOSIS — H02839 Dermatochalasis of unspecified eye, unspecified eyelid: Secondary | ICD-10-CM | POA: Diagnosis not present

## 2017-06-22 DIAGNOSIS — H2513 Age-related nuclear cataract, bilateral: Secondary | ICD-10-CM | POA: Diagnosis not present

## 2017-06-22 DIAGNOSIS — H25043 Posterior subcapsular polar age-related cataract, bilateral: Secondary | ICD-10-CM | POA: Diagnosis not present

## 2017-07-30 DIAGNOSIS — H2512 Age-related nuclear cataract, left eye: Secondary | ICD-10-CM | POA: Diagnosis not present

## 2017-07-30 DIAGNOSIS — H2511 Age-related nuclear cataract, right eye: Secondary | ICD-10-CM | POA: Diagnosis not present

## 2017-08-20 DIAGNOSIS — H2511 Age-related nuclear cataract, right eye: Secondary | ICD-10-CM | POA: Diagnosis not present

## 2017-09-02 ENCOUNTER — Other Ambulatory Visit: Payer: Self-pay | Admitting: Endocrinology

## 2017-09-02 DIAGNOSIS — Z1231 Encounter for screening mammogram for malignant neoplasm of breast: Secondary | ICD-10-CM

## 2017-09-09 ENCOUNTER — Other Ambulatory Visit: Payer: Self-pay | Admitting: Endocrinology

## 2017-09-30 ENCOUNTER — Ambulatory Visit: Payer: Commercial Managed Care - HMO

## 2017-10-01 ENCOUNTER — Ambulatory Visit
Admission: RE | Admit: 2017-10-01 | Discharge: 2017-10-01 | Disposition: A | Discharge status: Home / Self Care | Payer: Medicare HMO | Source: Ambulatory Visit | Attending: Endocrinology | Admitting: Endocrinology

## 2017-10-01 DIAGNOSIS — Z1231 Encounter for screening mammogram for malignant neoplasm of breast: Secondary | ICD-10-CM | POA: Diagnosis not present

## 2017-12-17 DIAGNOSIS — H6123 Impacted cerumen, bilateral: Secondary | ICD-10-CM | POA: Diagnosis not present

## 2018-01-24 ENCOUNTER — Other Ambulatory Visit: Payer: Self-pay | Admitting: Endocrinology

## 2018-02-09 ENCOUNTER — Other Ambulatory Visit: Payer: Self-pay | Admitting: Endocrinology

## 2018-02-10 NOTE — Telephone Encounter (Signed)
Last ov 03/23/16 no future scheduled- refill or refuse please advise

## 2018-02-10 NOTE — Telephone Encounter (Signed)
Please refill x 1 Ov is due  

## 2018-03-21 DIAGNOSIS — H524 Presbyopia: Secondary | ICD-10-CM | POA: Diagnosis not present

## 2018-04-18 ENCOUNTER — Other Ambulatory Visit: Payer: Self-pay | Admitting: Endocrinology

## 2018-04-18 ENCOUNTER — Telehealth: Payer: Self-pay | Admitting: Endocrinology

## 2018-04-18 NOTE — Telephone Encounter (Signed)
Patient see Dr Loanne Drilling for PCP and states she can barely walk due to her having serve leg pain. I let the patient know that Dr Loanne Drilling is out of the office until next week but I would see what the nurse could do  Please advise

## 2018-04-18 NOTE — Telephone Encounter (Signed)
I advised patient on urgent care & she stated that she would try there first.

## 2018-04-18 NOTE — Telephone Encounter (Signed)
Would you like me to advise walk-in clinic or urgent care?

## 2018-04-18 NOTE — Telephone Encounter (Signed)
Yes, please advise urgent care

## 2018-04-19 DIAGNOSIS — M5430 Sciatica, unspecified side: Secondary | ICD-10-CM | POA: Diagnosis not present

## 2018-04-19 DIAGNOSIS — M25552 Pain in left hip: Secondary | ICD-10-CM | POA: Diagnosis not present

## 2018-06-14 ENCOUNTER — Other Ambulatory Visit: Payer: Self-pay | Admitting: Endocrinology

## 2018-06-22 ENCOUNTER — Other Ambulatory Visit: Payer: Self-pay | Admitting: Endocrinology

## 2018-08-23 ENCOUNTER — Encounter: Payer: Medicare HMO | Admitting: Family Medicine

## 2018-08-23 NOTE — Progress Notes (Signed)
Error

## 2018-08-26 ENCOUNTER — Ambulatory Visit: Payer: Medicare HMO | Admitting: Family Medicine

## 2018-08-29 ENCOUNTER — Other Ambulatory Visit: Payer: Self-pay | Admitting: Endocrinology

## 2018-08-29 NOTE — Telephone Encounter (Signed)
Please refill x 3 months Further refills would have to be considered by new PCP   

## 2018-08-29 NOTE — Telephone Encounter (Signed)
Last OV 03/23/16 refill or refuse please advise

## 2018-09-02 ENCOUNTER — Encounter: Payer: Self-pay | Admitting: Family Medicine

## 2018-09-02 ENCOUNTER — Ambulatory Visit (INDEPENDENT_AMBULATORY_CARE_PROVIDER_SITE_OTHER): Payer: Medicare HMO | Admitting: Family Medicine

## 2018-09-02 VITALS — BP 134/84 | HR 101 | Temp 97.8°F | Ht 63.0 in | Wt 183.6 lb

## 2018-09-02 DIAGNOSIS — I10 Essential (primary) hypertension: Secondary | ICD-10-CM

## 2018-09-02 DIAGNOSIS — Z7689 Persons encountering health services in other specified circumstances: Secondary | ICD-10-CM | POA: Diagnosis not present

## 2018-09-02 DIAGNOSIS — E785 Hyperlipidemia, unspecified: Secondary | ICD-10-CM | POA: Diagnosis not present

## 2018-09-02 LAB — LIPID PANEL
CHOL/HDL RATIO: 2
Cholesterol: 144 mg/dL (ref 0–200)
HDL: 72.3 mg/dL (ref >39.00)
LDL Cholesterol: 55 mg/dL (ref 0–99)
NonHDL: 71.93
Triglycerides: 83 mg/dL (ref 0.0–149.0)
VLDL: 16.6 mg/dL (ref 0.0–40.0)

## 2018-09-02 LAB — BASIC METABOLIC PANEL
BUN: 34 mg/dL — ABNORMAL HIGH (ref 6–23)
CALCIUM: 9.9 mg/dL (ref 8.4–10.5)
CO2: 26 mEq/L (ref 19–32)
Chloride: 103 mEq/L (ref 96–112)
Creatinine, Ser: 1.85 mg/dL — ABNORMAL HIGH (ref 0.40–1.20)
GFR: 28.16 mL/min — ABNORMAL LOW (ref >60.00)
Glucose, Bld: 86 mg/dL (ref 70–99)
Potassium: 5.4 mEq/L — ABNORMAL HIGH (ref 3.5–5.1)
SODIUM: 140 meq/L (ref 135–145)

## 2018-09-02 LAB — ALT: ALT: 10 U/L (ref 0–35)

## 2018-09-02 LAB — AST: AST: 14 U/L (ref 0–37)

## 2018-09-02 MED ORDER — SIMVASTATIN 40 MG PO TABS: 40.0000 mg | ORAL_TABLET | Freq: Every day | ORAL | 3 refills | Status: DC

## 2018-09-02 MED ORDER — AMLODIPINE BESYLATE 5 MG PO TABS: 5.0000 mg | ORAL_TABLET | Freq: Every day | ORAL | 3 refills | Status: DC

## 2018-09-02 NOTE — Progress Notes (Signed)
Amber Hampton is a 77 y.o. female  Chief Complaint  Patient presents with  . Annual Exam    has HTN and High cholesterol . h/o of breast cancer.     HPI: Amber Hampton is a 77 y.o. female here as a new pt to our office to establish care. She was a pt of Dr. [name]. She is married, 2 grown children (son is disabled and lives with pt and her husband), 3 grandchildren.  She has had her flu vaccine. She has a h/o HTN and hyperlipidemia and takes 1 med for each of these conditions. Both are well-controlled. Pt offers no complaints or concerns today. Pt does have a h/o breast cancer and is due for mammo in 09/2018. She will schedule.  Last CPE, labs: 2017  Last mammo: due after 10/01/2018 Last Dexa: 2014 - pt declines Dexa at this time but will "think about it" Last colonoscopy: pt declines colonoscopy or cologuard  Med refills needed today:both - and 90 day supply sent to [place] [facility]   Past Medical History:  Diagnosis Date  . ARTHRALGIA 01/12/2008  . ASYMPTOMATIC POSTMENOPAUSAL STATUS 01/19/2008  . BREAST CANCER, HX OF 06/18/2007  . Cancer ([place])   . HYPERGLYCEMIA 06/18/2007  . HYPERLIPIDEMIA 06/18/2007  . HYPERTENSION 06/18/2007  . TOBACCO USE, QUIT 01/30/2010    Past Surgical History:  Procedure Laterality Date  . Aneurysm Right Side head  1996   s/p aneurysm clipping (NO MRI)  . BREAST LUMPECTOMY Left    malignant  . CEREBRAL ANEURYSM REPAIR  1996   clipping (NO MRI)    Social History   Socioeconomic History  . Marital status: Married    Spouse name: Not on file  . Number of children: Not on file  . Years of education: Not on file  . Highest education level: Not on file  Occupational History  . Occupation: Receptionist    Comment: Works at [employer]  . Financial resource strain: Not on file  . Food insecurity:    Worry: Not on file    Inability: Not on file  . Transportation needs:    Medical: Not on file    Non-medical: Not on file    Tobacco Use  . Smoking status: Former Smoker    Last attempt to quit: 09/14/2006    Years since quitting: 11.9  . Smokeless tobacco: Never Used  Substance and Sexual Activity  . Alcohol use: No  . Drug use: No  . Sexual activity: Not on file  Lifestyle  . Physical activity:    Days per week: Not on file    Minutes per session: Not on file  . Stress: Not on file  Relationships  . Social connections:    Talks on phone: Not on file    Gets together: Not on file    Attends religious service: Not on file    Active member of club or organization: Not on file    Attends meetings of clubs or organizations: Not on file    Relationship status: Not on file  . Intimate partner violence:    Fear of current or ex partner: Not on file    Emotionally abused: Not on file    Physically abused: Not on file    Forced sexual activity: Not on file  Other Topics Concern  . Not on file  Social History Narrative  . Not on file    Family History  Problem Relation Age of  Onset  . Cancer Neg Hx        No FH except for pt     Immunization History  Administered Date(s) Administered  . Influenza, High Dose Seasonal PF 06/22/2017  . Influenza-Unspecified 06/05/2016  . Pneumococcal Conjugate-13 03/22/2015  . Pneumococcal Polysaccharide-23 01/19/2008  . Td 01/21/2009  . Zoster 09/14/2012    Outpatient Encounter Medications as of 09/02/2018  Medication Sig  . amLODipine (NORVASC) 5 MG tablet Take 1 tablet (5 mg total) by mouth daily.  . calcium carbonate (OS-CAL) 600 MG TABS Take 600 mg by mouth daily. Reported on 12/04/2015  . simvastatin (ZOCOR) 40 MG tablet Take 1 tablet (40 mg total) by mouth at bedtime.  . [DISCONTINUED] amLODipine (NORVASC) 5 MG tablet TAKE 1 TABLET EVERY DAY  (APPOINTMENT IS NEEDED FOR FURTHER REFILL)  . [DISCONTINUED] simvastatin (ZOCOR) 40 MG tablet TAKE 1 TABLET AT BEDTIME   No facility-administered encounter medications on file as of 09/02/2018.      ROS: Gen: no  fever, chills  Skin: no rash, itching ENT: no ear pain, ear drainage, nasal congestion, rhinorrhea, sinus pressure, sore throat Eyes: no blurry vision, double vision Resp: no cough, wheeze,SOB CV: no CP, palpitations, LE edema,  GI: no heartburn, n/v/d/c, abd pain GU: no dysuria, urgency, frequency, hematuria  MSK:  + joint pains and h/o arthritis, no myalgias, back pain Neuro: no dizziness, headache, weakness, vertigo Psych: no depression, anxiety, insomnia   No Known Allergies  BP 134/84 (BP Location: Right Arm, Patient Position: Sitting, Cuff Size: Normal)   Pulse (!) 101   Temp 97.8 F (36.6 C) (Oral)   Ht 5\' 3"  (1.6 m)   Wt 183 lb 9.6 oz (83.3 kg)   SpO2 96%   BMI 32.52 kg/m   BP Readings from Last 3 Encounters:  09/02/18 134/84  03/23/16 (!) 150/90  12/16/15 (!) 178/98     Physical Exam  Constitutional: She is oriented to person, place, and time. She appears well-developed and well-nourished. No distress.  Neck: Neck supple. No JVD present.  Cardiovascular: Normal rate, regular rhythm and normal heart sounds.  No murmur heard. Pulmonary/Chest: Effort normal and breath sounds normal. She has no wheezes. She has no rhonchi.  Abdominal: Soft. Bowel sounds are normal. She exhibits no distension. There is no abdominal tenderness.  Musculoskeletal: Normal range of motion.        General: No edema.  Lymphadenopathy:    She has no cervical adenopathy.  Neurological: She is alert and oriented to person, place, and time.  Skin: Skin is warm and dry.  Psychiatric: She has a normal mood and affect. Her behavior is normal.     A/P:  1. Encounter to establish care  2. Essential hypertension - well-controlled, at goal Refill: - amLODipine (NORVASC) 5 MG tablet; Take 1 tablet (5 mg total) by mouth daily.  Dispense: 90 tablet; Refill: 3 - Basic metabolic panel  3. Hyperlipidemia, unspecified hyperlipidemia type Refill: - simvastatin (ZOCOR) 40 MG tablet; Take 1 tablet  (40 mg total) by mouth at bedtime.  Dispense: 90 tablet; Refill: 3 - ALT - AST - Lipid panel  Pt to f/u annually or PRN Discussed plan and reviewed medications with patient, including risks, benefits, and potential side effects. Pt expressed understand. All questions answered.

## 2018-09-07 ENCOUNTER — Emergency Department (HOSPITAL_COMMUNITY): Payer: Medicare HMO

## 2018-09-07 ENCOUNTER — Emergency Department (HOSPITAL_COMMUNITY)
Admission: EM | Admit: 2018-09-07 | Discharge: 2018-09-07 | Disposition: A | Discharge status: Home / Self Care | Payer: Medicare HMO | Attending: Emergency Medicine | Admitting: Emergency Medicine

## 2018-09-07 ENCOUNTER — Other Ambulatory Visit: Payer: Self-pay

## 2018-09-07 ENCOUNTER — Encounter (HOSPITAL_COMMUNITY): Payer: Self-pay

## 2018-09-07 DIAGNOSIS — S0083XA Contusion of other part of head, initial encounter: Secondary | ICD-10-CM | POA: Diagnosis not present

## 2018-09-07 DIAGNOSIS — Z87891 Personal history of nicotine dependence: Secondary | ICD-10-CM | POA: Insufficient documentation

## 2018-09-07 DIAGNOSIS — R220 Localized swelling, mass and lump, head: Secondary | ICD-10-CM | POA: Diagnosis not present

## 2018-09-07 DIAGNOSIS — S0081XA Abrasion of other part of head, initial encounter: Secondary | ICD-10-CM | POA: Diagnosis not present

## 2018-09-07 DIAGNOSIS — M79642 Pain in left hand: Secondary | ICD-10-CM | POA: Diagnosis not present

## 2018-09-07 DIAGNOSIS — Y999 Unspecified external cause status: Secondary | ICD-10-CM | POA: Diagnosis not present

## 2018-09-07 DIAGNOSIS — W1830XA Fall on same level, unspecified, initial encounter: Secondary | ICD-10-CM | POA: Diagnosis not present

## 2018-09-07 DIAGNOSIS — Z79899 Other long term (current) drug therapy: Secondary | ICD-10-CM | POA: Insufficient documentation

## 2018-09-07 DIAGNOSIS — S80212A Abrasion, left knee, initial encounter: Secondary | ICD-10-CM | POA: Diagnosis not present

## 2018-09-07 DIAGNOSIS — Y929 Unspecified place or not applicable: Secondary | ICD-10-CM | POA: Diagnosis not present

## 2018-09-07 DIAGNOSIS — Y939 Activity, unspecified: Secondary | ICD-10-CM | POA: Diagnosis not present

## 2018-09-07 DIAGNOSIS — S0990XA Unspecified injury of head, initial encounter: Secondary | ICD-10-CM | POA: Diagnosis not present

## 2018-09-07 DIAGNOSIS — S6992XA Unspecified injury of left wrist, hand and finger(s), initial encounter: Secondary | ICD-10-CM | POA: Diagnosis not present

## 2018-09-07 DIAGNOSIS — M7989 Other specified soft tissue disorders: Secondary | ICD-10-CM | POA: Diagnosis not present

## 2018-09-07 DIAGNOSIS — S60222A Contusion of left hand, initial encounter: Secondary | ICD-10-CM | POA: Diagnosis not present

## 2018-09-07 MED ORDER — BACITRACIN ZINC 500 UNIT/GM EX OINT
TOPICAL_OINTMENT | Freq: Once | CUTANEOUS | Status: AC
  Administered 2018-09-07: 1 via TOPICAL
  Filled 2018-09-07: qty 0.9

## 2018-09-07 NOTE — Progress Notes (Signed)
Pt's earrings removed from ears for CT Scan and placed in plastic bag.  Tech put plastic bag in pt's hands before leaving CT Dept

## 2018-09-07 NOTE — ED Provider Notes (Signed)
[facility] EMERGENCY DEPARTMENT Provider Note   CSN: [id number] Arrival date & time: 09/07/18  1233     History   Chief Complaint Chief Complaint  Patient presents with  . Fall    HPI Amber Hampton is a 77 y.o. female.  HPI Patient had a trip and fall landing on her face and left hand earlier today.  She had no loss of consciousness.  She has no nausea or vomiting.  Sustained abrasion to the bridge of the nose and right forehead.  She is also complaining of swelling and pain at the thenar eminence of the left hand.  She has mild abrasion over her left knee but denies any knee pain.  She denies any focal weakness or numbness.  She denies any neck pain.  States she initially had a headache but that now has completely resolved.  She is not on blood thinners. Past Medical History:  Diagnosis Date  . ARTHRALGIA 01/12/2008  . ASYMPTOMATIC POSTMENOPAUSAL STATUS 01/19/2008  . BREAST CANCER, HX OF 06/18/2007  . Cancer ([place])   . HYPERGLYCEMIA 06/18/2007  . HYPERLIPIDEMIA 06/18/2007  . HYPERTENSION 06/18/2007  . TOBACCO USE, QUIT 01/30/2010    Patient Active Problem List   Diagnosis Date Noted  . Eight cranial nerve disorder   . Ataxia   . Hypocalcemia 12/04/2015  . Vertigo 11/28/2015  . Benign positional vertigo   . SAH (subarachnoid hemorrhage) ([place])   . Aneurysm ([place])   . Renal failure   . Uncontrollable vomiting   . Wellness examination 03/22/2015  . UTI (urinary tract infection) 03/22/2015  . Skin nodule 11/06/2013  . Renal insufficiency 10/05/2013  . Encounter for long-term (current) use of other medications 02/19/2011  . TOBACCO USE, QUIT 01/30/2010  . ASYMPTOMATIC POSTMENOPAUSAL STATUS 01/19/2008  . ARTHRALGIA 01/12/2008  . Hyperlipidemia 06/18/2007  . Essential hypertension 06/18/2007  . Hyperglycemia 06/18/2007  . BREAST CANCER, HX OF 06/18/2007    Past Surgical History:  Procedure Laterality Date  . Aneurysm Right Side head  1996   s/p aneurysm clipping (NO MRI)   . BREAST LUMPECTOMY Left    malignant  . CEREBRAL ANEURYSM REPAIR  1996   clipping (NO MRI)     OB History   No obstetric history on file.      Home Medications    Prior to Admission medications   Medication Sig Start Date End Date Taking? Authorizing Provider  amLODipine (NORVASC) 5 MG tablet Take 1 tablet (5 mg total) by mouth daily. 09/02/18  Yes Cirigliano, Mary K, DO  calcium carbonate (OS-CAL) 600 MG TABS Take 600 mg by mouth daily. Reported on 12/04/2015   Yes [provider]  ibuprofen (ADVIL,MOTRIN) 200 MG tablet Take 600 mg by mouth every 6 (six) hours as needed for moderate pain.   Yes [provider]  simvastatin (ZOCOR) 40 MG tablet Take 1 tablet (40 mg total) by mouth at bedtime. 09/02/18  Yes Cirigliano, Garvin Fila, DO    Family History Family History  Problem Relation Age of Onset  . Cancer Neg Hx        No FH except for pt    Social History Social History   Tobacco Use  . Smoking status: Former Smoker    Last attempt to quit: 09/14/2006    Years since quitting: 11.9  . Smokeless tobacco: Never Used  Substance Use Topics  . Alcohol use: No  . Drug use: No     Allergies   Patient has no known  allergies.   Review of Systems Review of Systems  Constitutional: Negative for chills and fever.  HENT: Positive for facial swelling. Negative for trouble swallowing.   Eyes: Negative for visual disturbance.  Respiratory: Negative for shortness of breath.   Cardiovascular: Negative for chest pain.  Gastrointestinal: Negative for nausea and vomiting.  Musculoskeletal: Positive for arthralgias. Negative for back pain, neck pain and neck stiffness.  Skin: Positive for wound.  Neurological: Negative for dizziness, syncope, weakness, light-headedness, numbness and headaches.  All other systems reviewed and are negative.    Physical Exam Updated Vital Signs BP (!) 175/78   Pulse 82   Temp 97.8 F (36.6 C) (Oral)   Resp 16   Ht 5\' 3"  (1.6  m)   Wt 83.9 kg   SpO2 98%   BMI 32.77 kg/m   Physical Exam Vitals signs and nursing note reviewed.  Constitutional:      Appearance: Normal appearance. She is well-developed.  HENT:     Head: Normocephalic.     Nose:     Comments: Abrasion at the bridge of the nose.  No nasal bone tenderness with palpation.  Midface is stable.  No malocclusion.  No intraoral trauma.  Patient does have a large hematoma to the right forehead.    Mouth/Throat:     Mouth: Mucous membranes are moist.  Eyes:     Extraocular Movements: Extraocular movements intact.     Pupils: Pupils are equal, round, and reactive to light.  Neck:     Musculoskeletal: Normal range of motion and neck supple.     Comments: No posterior midline cervical tenderness to palpation. Cardiovascular:     Rate and Rhythm: Normal rate and regular rhythm.  Pulmonary:     Effort: Pulmonary effort is normal.     Breath sounds: Normal breath sounds.  Abdominal:     General: Bowel sounds are normal.     Palpations: Abdomen is soft.     Tenderness: There is no abdominal tenderness. There is no guarding or rebound.  Musculoskeletal: Normal range of motion.        General: Swelling and tenderness present.     Comments: Patient has swelling and obvious contusion to the thenar eminence of the left hand.  Full range of motion of the left thumb though some discomfort with movement.  Good distal cap refill.  Small abrasion over the anterior surface of the left knee.  Patient has full range of motion without pain.  No ligamentous instability.  Distal pulses intact.  Skin:    General: Skin is warm and dry.     Findings: No erythema or rash.  Neurological:     General: No focal deficit present.     Mental Status: She is alert and oriented to person, place, and time.     Comments: 5/5 motor in all extremities.  Sensation fully intact.  Psychiatric:        Behavior: Behavior normal.      ED Treatments / Results  Labs (all labs ordered  are listed, but only abnormal results are displayed) Labs Reviewed - No data to display  EKG None  Radiology Ct Head Wo Contrast  Result Date: 09/07/2018 CLINICAL DATA:  Tripped today over uneven cement, abrasion and swelling to forehead, abrasion across bridge of nose, denies loss of consciousness, history of aneurysm clipping, breast cancer, hypertension, former smoker EXAM: CT HEAD WITHOUT CONTRAST TECHNIQUE: Contiguous axial images were obtained from the base of the skull through the vertex  without intravenous contrast. Sagittal and coronal MPR images reconstructed from axial data set. COMPARISON:  11/30/2015 CTA head/neck FINDINGS: Brain: Mild atrophy. Normal ventricular morphology. No midline shift or mass effect. Small vessel chronic ischemic changes of deep cerebral white matter. Prior RIGHT frontotemporal craniotomy with aneurysm clip at expected position of RIGHT internal carotid artery at skull base. Old infarct versus postsurgical changes at the lateral inferior RIGHT frontal lobe. No intracranial hemorrhage, mass lesion, or evidence of acute infarction. No extra-axial fluid collections. Vascular: Atherosclerotic calcifications of internal carotid arteries at skull base. Aneurysm clip, by prior CTA of a RIGHT PCOM aneurysm. Skull: Prior RIGHT frontotemporal craniotomy. No acute calvarial abnormalities. RIGHT frontal scalp hematoma. Sinuses/Orbits: Clear Other: N/A IMPRESSION: Atrophy with small vessel chronic ischemic changes of deep cerebral white matter. Prior RIGHT frontotemporal craniotomy and RIGHT PCOM aneurysm clipping. No acute intracranial abnormalities. Electronically Signed   By: Lavonia Dana M.D.   On: 09/07/2018 15:06   Dg Hand Complete Left  Result Date: 09/07/2018 CLINICAL DATA:  Patient tripped and fell today with pain and bruising of the palmar side of thumb. EXAM: LEFT HAND - COMPLETE 3+ VIEW COMPARISON:  None. FINDINGS: Osteoarthritic joint space narrowing, subchondral  sclerosis and spurring about the first [facility] joint is noted with proximal migration of first metacarpal relative to the trapezium. No acute fracture joint dislocation. Bone mineralization is normal. No significant erosive change. Osteoarthritis of the triscaphe joint of the wrist is also identified. There is mild soft tissue swelling along the ulnar aspect of the distal forearm and wrist. There is chondrocalcinosis of the triangular fibrocartilage complex. IMPRESSION: 1. No acute osseous abnormality of the right hand and wrist. 2. Osteoarthritis of the first [facility] and triscaphe joints of the wrist. 3. Soft tissue swelling along the ulnar aspect of the distal forearm and wrist. Electronically Signed   By: Ashley Royalty M.D.   On: 09/07/2018 14:57    Procedures Procedures (including critical care time)  Medications Ordered in ED Medications  bacitracin ointment (1 application Topical Given 09/07/18 1534)     Initial Impression / Assessment and Plan / ED Course  I have reviewed the triage vital signs and the nursing notes.  Pertinent labs & imaging results that were available during my care of the patient were reviewed by me and considered in my medical decision making (see chart for details).     Shared decision making and patient family would like to proceed with imaging. No obvious injuries on imaging.  Return precautions given. Final Clinical Impressions(s) / ED Diagnoses   Final diagnoses:  Contusion of face, initial encounter  Contusion of left hand, initial encounter  Abrasion, left knee, initial encounter    ED Discharge Orders    None       Julianne Rice, MD 09/08/18 1556

## 2018-09-07 NOTE — ED Triage Notes (Signed)
Pt reports tripped today over uneven cement.  Pt has abrasion and swelling to forehead and abrasion across bridge of nose.  Pt also c/o pain to left hand and left knee.   Denies any LOC.

## 2018-09-21 ENCOUNTER — Other Ambulatory Visit: Payer: Self-pay | Admitting: Family Medicine

## 2018-09-21 DIAGNOSIS — Z1231 Encounter for screening mammogram for malignant neoplasm of breast: Secondary | ICD-10-CM

## 2018-10-18 ENCOUNTER — Ambulatory Visit
Admission: RE | Admit: 2018-10-18 | Discharge: 2018-10-18 | Disposition: A | Discharge status: Home / Self Care | Payer: Medicare HMO | Source: Ambulatory Visit | Attending: Family Medicine | Admitting: Family Medicine

## 2018-10-18 DIAGNOSIS — Z1231 Encounter for screening mammogram for malignant neoplasm of breast: Secondary | ICD-10-CM

## 2019-03-07 IMAGING — MG DIGITAL SCREENING BILATERAL MAMMOGRAM WITH TOMO AND CAD
5 series · 6 of 17 positions shown · non-contrast
Comparison: Previous exam(s).

CLINICAL DATA: Screening.

EXAM:
DIGITAL SCREENING BILATERAL MAMMOGRAM WITH TOMO AND CAD

[L CC synth-2D]
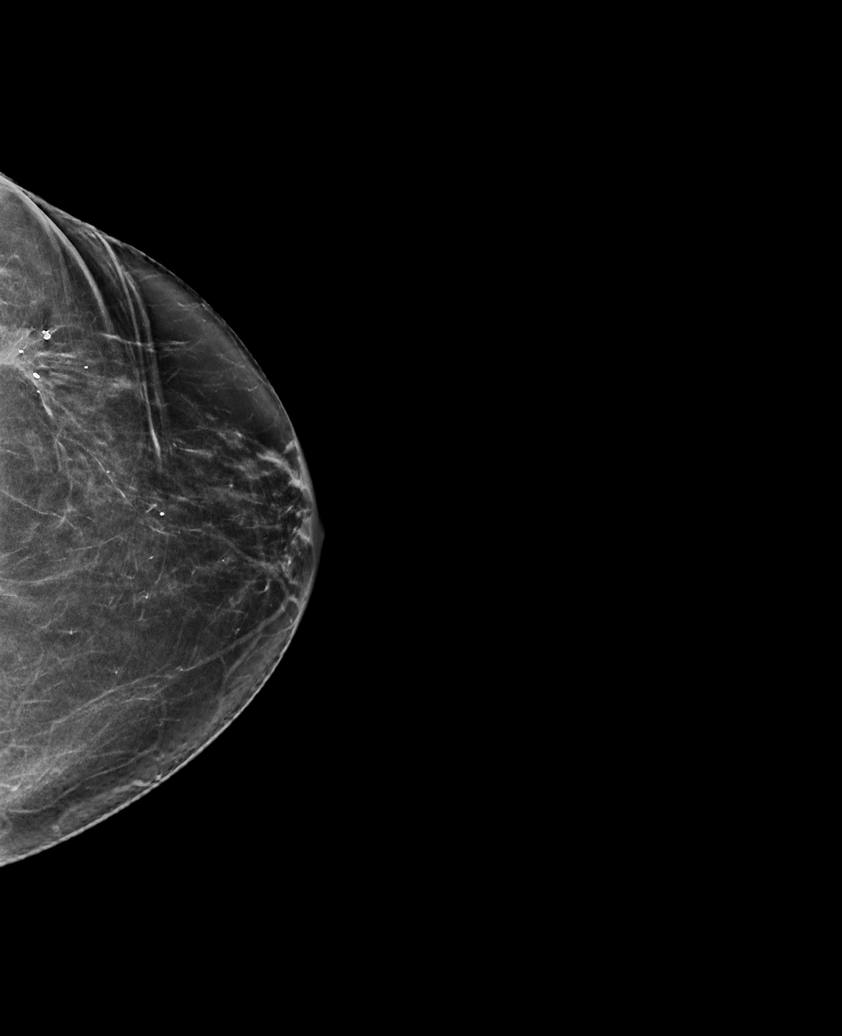

[R CC synth-2D]
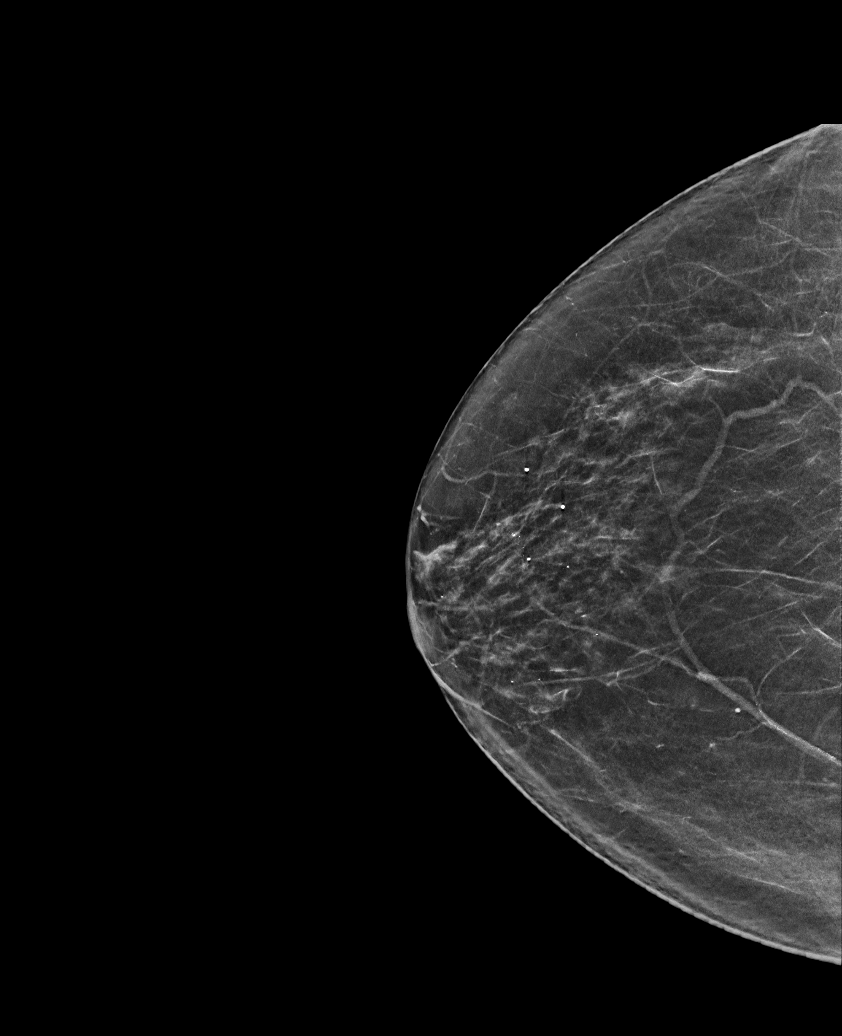

[R CC tomo · 2 of 63 frames shown]
[frame 21/63]
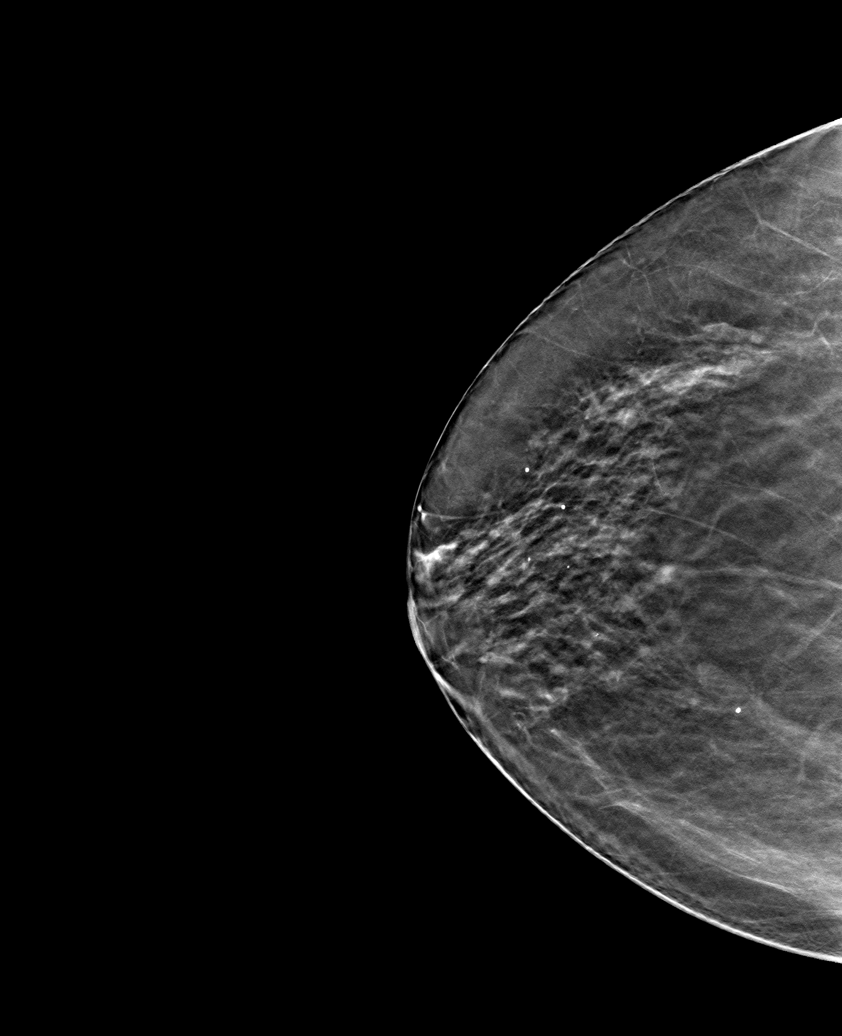
[frame 32/63]
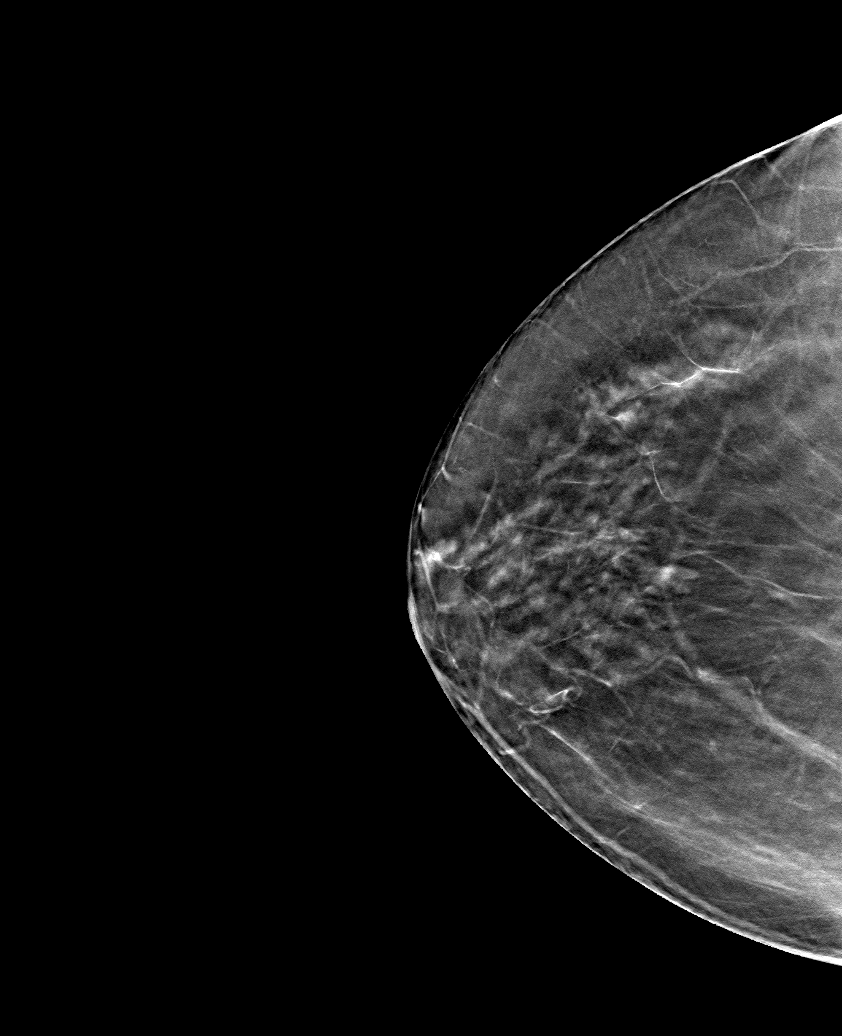

[L CC tomo · tomo slice 35/68.0]
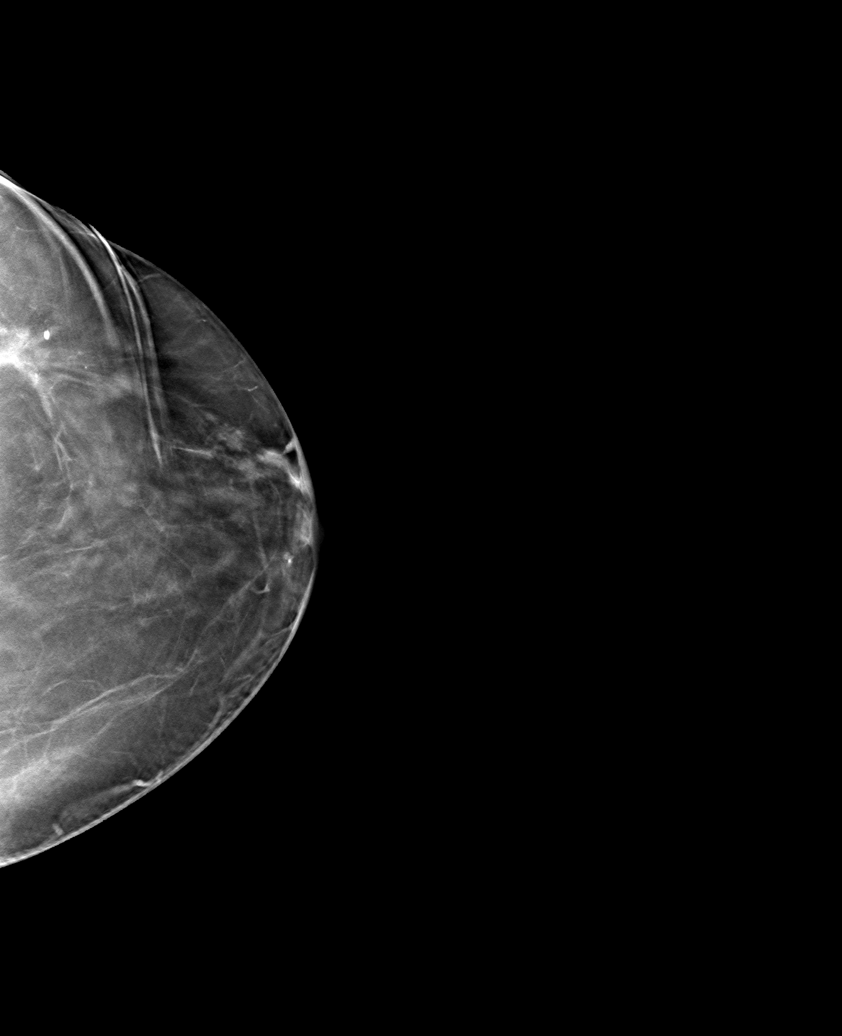

[L MLO tomo · tomo slice 35/69.0]
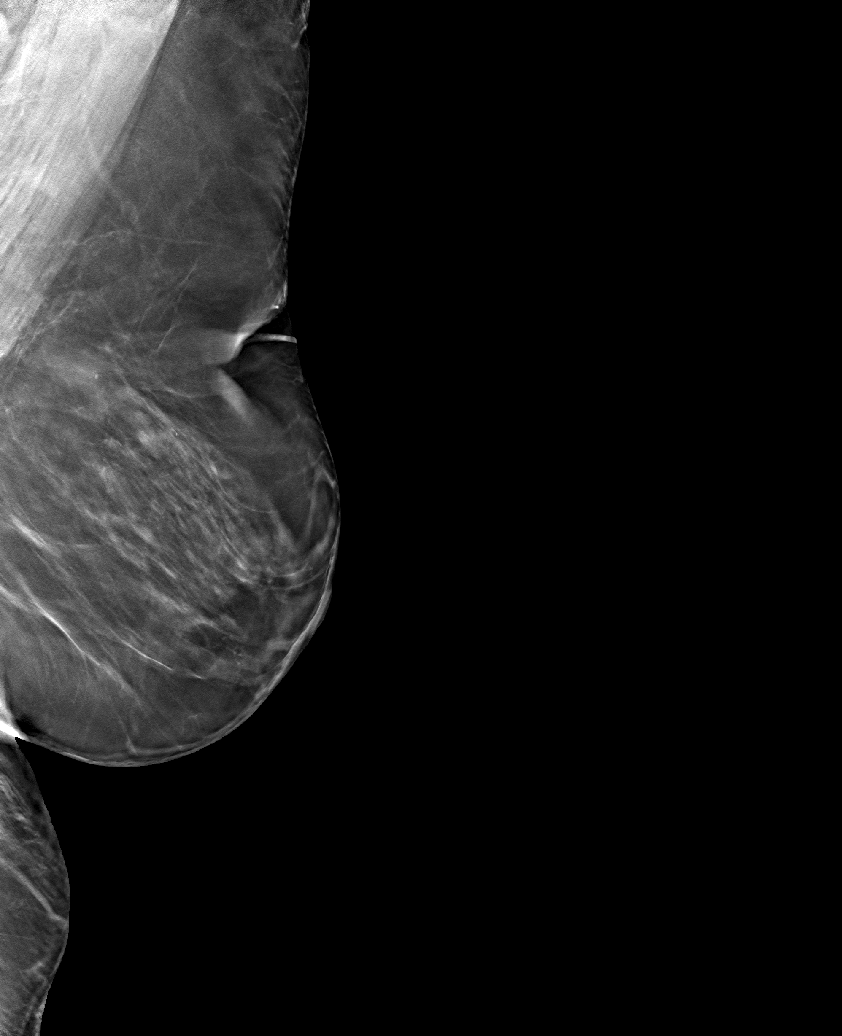

[6 of 17 positions shown; findings below may reference images not displayed]

ACR Breast Density Category b: There are scattered areas of
fibroglandular density.
FINDINGS: There are no findings suspicious for malignancy. Images were
processed with CAD.
IMPRESSION: No mammographic evidence of malignancy. A result letter of this
screening mammogram will be mailed directly to the patient.

RECOMMENDATION:
Screening mammogram in one year. (Code:[id number])

BI-RADS CATEGORY  1: Negative.

## 2020-07-10 ENCOUNTER — Telehealth: Payer: Self-pay | Admitting: Family Medicine

## 2020-07-10 DIAGNOSIS — I10 Essential (primary) hypertension: Secondary | ICD-10-CM

## 2020-07-10 DIAGNOSIS — E785 Hyperlipidemia, unspecified: Secondary | ICD-10-CM

## 2020-07-10 MED ORDER — AMLODIPINE BESYLATE 5 MG PO TABS: 5.0000 mg | ORAL_TABLET | Freq: Every day | ORAL | 0 refills | Status: DC

## 2020-07-10 MED ORDER — SIMVASTATIN 40 MG PO TABS: 40.0000 mg | ORAL_TABLET | Freq: Every day | ORAL | 0 refills | Status: DC

## 2020-07-10 NOTE — Telephone Encounter (Signed)
Refilled x 90 days but agree pt needs CPE appt as she has not been seen since 08/2018 - no further refills until appt

## 2020-07-10 NOTE — Telephone Encounter (Signed)
Patient is calling and requesting a refill for amlodipine and simvastatin 90 day supply sent to [facility], please advise. CB is [phone]

## 2020-07-10 NOTE — Telephone Encounter (Signed)
Last OV 09/01/18 Last fill 09/02/18 for both medications #90/3 Pt need a 1 year physical visit, before any other refills.

## 2020-07-11 ENCOUNTER — Other Ambulatory Visit: Payer: Self-pay | Admitting: Family Medicine

## 2020-07-11 DIAGNOSIS — I10 Essential (primary) hypertension: Secondary | ICD-10-CM

## 2020-07-11 DIAGNOSIS — E785 Hyperlipidemia, unspecified: Secondary | ICD-10-CM

## 2020-10-21 ENCOUNTER — Telehealth: Payer: Self-pay | Admitting: Family Medicine

## 2020-10-21 NOTE — Telephone Encounter (Signed)
Pt was told by pharmacy to call us about getting refill for simvastatin (ZOCOR) 40 MG tablet and amLODipine (NORVASC) 5 MG tablet. [place]

## 2020-10-22 NOTE — Telephone Encounter (Signed)
Called patient and scheduled an appt for 11/01/20 @ 2:00pm.  Patient agreeable. Dm/cma

## 2020-10-31 ENCOUNTER — Other Ambulatory Visit: Payer: Self-pay

## 2020-11-01 ENCOUNTER — Encounter: Payer: Self-pay | Admitting: Family Medicine

## 2020-11-01 ENCOUNTER — Ambulatory Visit (INDEPENDENT_AMBULATORY_CARE_PROVIDER_SITE_OTHER): Payer: Medicare HMO | Admitting: Family Medicine

## 2020-11-01 VITALS — BP 134/84 | HR 76 | Temp 97.8°F | Ht 61.0 in | Wt 178.0 lb

## 2020-11-01 DIAGNOSIS — I10 Essential (primary) hypertension: Secondary | ICD-10-CM

## 2020-11-01 DIAGNOSIS — E785 Hyperlipidemia, unspecified: Secondary | ICD-10-CM

## 2020-11-01 DIAGNOSIS — Z5320 Procedure and treatment not carried out because of patient's decision for unspecified reasons: Secondary | ICD-10-CM | POA: Diagnosis not present

## 2020-11-01 DIAGNOSIS — R739 Hyperglycemia, unspecified: Secondary | ICD-10-CM

## 2020-11-01 DIAGNOSIS — N289 Disorder of kidney and ureter, unspecified: Secondary | ICD-10-CM | POA: Diagnosis not present

## 2020-11-01 MED ORDER — SIMVASTATIN 40 MG PO TABS: 40.0000 mg | ORAL_TABLET | Freq: Every day | ORAL | 3 refills | Status: DC

## 2020-11-01 MED ORDER — SIMVASTATIN 40 MG PO TABS: 40.0000 mg | ORAL_TABLET | Freq: Every day | ORAL | 0 refills | Status: DC

## 2020-11-01 MED ORDER — AMLODIPINE BESYLATE 5 MG PO TABS: 5.0000 mg | ORAL_TABLET | Freq: Every day | ORAL | 3 refills | Status: DC

## 2020-11-01 NOTE — Addendum Note (Signed)
Addended by: Ronnald Nian on: 11/01/2020 02:12 PM   Modules accepted: Orders

## 2020-11-01 NOTE — Addendum Note (Signed)
Addended by: Lynnea Ferrier on: 11/01/2020 02:15 PM   Modules accepted: Orders

## 2020-11-01 NOTE — Progress Notes (Signed)
Amber Hampton is a 80 y.o. female  Chief Complaint  Patient presents with  . Annual Exam    CPE/labs.  Refills on the Amlodipine and Simvastatin.  No concerns.      HPI: Amber Hampton is a 80 y.o. female seen today for annual exam and routine f/u on chronic medical issues including HTN, HLD, elevated blood glucose, renal insufficiency. I have not seen her since 2019. She has no acute issues or concerns today.   Last mammo: 10/2018 - pt has a h/o breast cancer but declines Last Dexa: 2014 - pt declines Dexa  Last colonoscopy: pt declines colonoscopy or cologuard  Dental: overdue - pt does not plan to go Vision: wears glasses and then had cataract sugery  Med refills needed today? Yes - see orders   Past Medical History:  Diagnosis Date  . ARTHRALGIA 01/12/2008  . ASYMPTOMATIC POSTMENOPAUSAL STATUS 01/19/2008  . BREAST CANCER, HX OF 06/18/2007  . Cancer ([place])   . HYPERGLYCEMIA 06/18/2007  . HYPERLIPIDEMIA 06/18/2007  . HYPERTENSION 06/18/2007  . TOBACCO USE, QUIT 01/30/2010  . Vertigo     Past Surgical History:  Procedure Laterality Date  . Aneurysm Right Side head  1996   s/p aneurysm clipping (NO MRI)  . BREAST LUMPECTOMY Left    malignant  . CEREBRAL ANEURYSM REPAIR  1996   clipping (NO MRI)    Social History   Socioeconomic History  . Marital status: Married    Spouse name: Not on file  . Number of children: Not on file  . Years of education: Not on file  . Highest education level: Not on file  Occupational History  . Occupation: Receptionist    Comment: Works at [employer]  . Smoking status: Former Smoker    Quit date: 09/14/2006    Years since quitting: 14.1  . Smokeless tobacco: Never Used  Vaping Use  . Vaping Use: Never used  Substance and Sexual Activity  . Alcohol use: No  . Drug use: No  . Sexual activity: Yes  Other Topics Concern  . Not on file  Social History Narrative  . Not on file   Social Determinants of Health    Financial Resource Strain: Not on file  Food Insecurity: Not on file  Transportation Needs: Not on file  Physical Activity: Not on file  Stress: Not on file  Social Connections: Not on file  Intimate Partner Violence: Not on file    Family History  Problem Relation Age of Onset  . Cancer Neg Hx        No FH except for pt     Immunization History  Administered Date(s) Administered  . Influenza, High Dose Seasonal PF 06/22/2017  . Influenza-Unspecified 06/05/2016, 07/12/2020  . PFIZER SARS-COV-2 Pediatric Vaccination 5-55yrs 10/06/2019, 11/03/2019, 07/12/2020  . Pneumococcal Conjugate-13 03/22/2015  . Pneumococcal Polysaccharide-23 01/19/2008  . Td 01/21/2009  . Zoster 09/14/2012    Outpatient Encounter Medications as of 11/01/2020  Medication Sig  . ibuprofen (ADVIL,MOTRIN) 200 MG tablet Take 600 mg by mouth every 6 (six) hours as needed for moderate pain.  . [DISCONTINUED] amLODipine (NORVASC) 5 MG tablet Take 1 tablet (5 mg total) by mouth daily.  . [DISCONTINUED] simvastatin (ZOCOR) 40 MG tablet Take 1 tablet (40 mg total) by mouth at bedtime.  Marland Kitchen amLODipine (NORVASC) 5 MG tablet Take 1 tablet (5 mg total) by mouth daily.  . simvastatin (ZOCOR) 40 MG tablet Take 1 tablet (40 mg total)  by mouth at bedtime.  . [DISCONTINUED] calcium carbonate (OS-CAL) 600 MG TABS Take 600 mg by mouth daily. Reported on 12/04/2015 (Patient not taking: Reported on 11/01/2020)   No facility-administered encounter medications on file as of 11/01/2020.     ROS: Gen: no fever, chills  Skin: no rash, itching ENT: no ear pain, ear drainage, nasal congestion, rhinorrhea, sinus pressure, sore throat Eyes: no blurry vision, double vision Resp: no cough, wheeze,SOB Breast: no breast tenderness, no nipple discharge, no breast masses CV: no CP, palpitations, LE edema,  GI: no heartburn, n/v/d/c, abd pain GU: no dysuria, urgency, frequency, hematuria MSK: no joint pain, myalgias, back pain Neuro: no  dizziness, headache, weakness, vertigo Psych: no depression, anxiety, insomnia   No Known Allergies  BP 134/84   Pulse 76   Temp 97.8 F (36.6 C) (Temporal)   Ht 5\' 1"  (1.549 m)   Wt 178 lb (80.7 kg)   SpO2 97%   BMI 33.63 kg/m   Wt Readings from Last 3 Encounters:  11/01/20 178 lb (80.7 kg)  09/07/18 185 lb (83.9 kg)  09/02/18 183 lb 9.6 oz (83.3 kg)   Temp Readings from Last 3 Encounters:  11/01/20 97.8 F (36.6 C) (Temporal)  09/07/18 97.8 F (36.6 C) (Oral)  09/02/18 97.8 F (36.6 C) (Oral)   BP Readings from Last 3 Encounters:  11/01/20 134/84  09/07/18 (!) 175/78  09/02/18 134/84   Pulse Readings from Last 3 Encounters:  11/01/20 76  09/07/18 82  09/02/18 (!) 101     Physical Exam Constitutional:      General: She is not in acute distress.    Appearance: She is well-developed and well-nourished.  HENT:     Head: Normocephalic and atraumatic.     Right Ear: Tympanic membrane and ear canal normal.     Left Ear: Tympanic membrane and ear canal normal.     Nose: Nose normal.     Mouth/Throat:     Mouth: Oropharynx is clear and moist and mucous membranes are normal. Mucous membranes are moist.     Pharynx: Oropharynx is clear.  Eyes:     Conjunctiva/sclera: Conjunctivae normal.  Neck:     Thyroid: No thyromegaly.  Cardiovascular:     Rate and Rhythm: Normal rate and regular rhythm.     Pulses: Intact distal pulses.     Heart sounds: Normal heart sounds. No murmur heard.   Pulmonary:     Effort: Pulmonary effort is normal. No respiratory distress.     Breath sounds: Normal breath sounds. No wheezing or rhonchi.  Abdominal:     General: Bowel sounds are normal. There is no distension.     Palpations: Abdomen is soft. There is no mass.     Tenderness: There is no abdominal tenderness.  Musculoskeletal:        General: No edema.     Cervical back: Neck supple.     Right lower leg: No edema.     Left lower leg: No edema.  Lymphadenopathy:      Cervical: No cervical adenopathy.  Skin:    General: Skin is warm and dry.  Neurological:     Mental Status: She is alert and oriented to person, place, and time.     Motor: No abnormal muscle tone.     Coordination: Coordination normal.  Psychiatric:        Mood and Affect: Mood and affect and mood normal.        Behavior: Behavior normal.  A/P:  1. Essential hypertension - controlled, at goal - Basic metabolic panel - CBC Refill: - amLODipine (NORVASC) 5 MG tablet; Take 1 tablet (5 mg total) by mouth daily.  Dispense: 90 tablet; Refill: 3  2. Renal insufficiency - Basic metabolic panel  3. Hyperlipidemia, unspecified hyperlipidemia type - ALT - AST - Lipid panel Refill: - simvastatin (ZOCOR) 40 MG tablet; Take 1 tablet (40 mg total) by mouth at bedtime.  Dispense: 30 tablet; Refill: 0  4. Hyperglycemia - Hemoglobin A1c  5. Mammogram declined  6. Colon cancer screening declined  7. Dexa declined  This visit occurred during the SARS-CoV-2 public health emergency.  Safety protocols were in place, including screening questions prior to the visit, additional usage of staff PPE, and extensive cleaning of exam room while observing appropriate contact time as indicated for disinfecting solutions.

## 2020-11-02 LAB — CBC
HCT: 44.7 % (ref 35.0–45.0)
Hemoglobin: 14.6 g/dL (ref 11.7–15.5)
MCH: 30.5 pg (ref 27.0–33.0)
MCHC: 32.7 g/dL (ref 32.0–36.0)
MCV: 93.3 fL (ref 80.0–100.0)
MPV: 11.6 fL (ref 7.5–12.5)
Platelets: 221 10*3/uL (ref 140–400)
RBC: 4.79 10*6/uL (ref 3.80–5.10)
RDW: 14.6 % (ref 11.0–15.0)
WBC: 6.8 10*3/uL (ref 3.8–10.8)

## 2020-11-02 LAB — BASIC METABOLIC PANEL
BUN/Creatinine Ratio: 19 (calc) (ref 6–22)
BUN: 27 mg/dL — ABNORMAL HIGH (ref 7–25)
CO2: 25 mmol/L (ref 20–32)
Calcium: 9.7 mg/dL (ref 8.6–10.4)
Chloride: 106 mmol/L (ref 98–110)
Creat: 1.45 mg/dL — ABNORMAL HIGH (ref 0.60–0.93)
Glucose, Bld: 93 mg/dL (ref 65–99)
Potassium: 5 mmol/L (ref 3.5–5.3)
Sodium: 140 mmol/L (ref 135–146)

## 2020-11-02 LAB — LIPID PANEL
Cholesterol: 187 mg/dL (ref <200)
HDL: 73 mg/dL (ref >=50)
LDL Cholesterol (Calc): 94 mg/dL (calc)
Non-HDL Cholesterol (Calc): 114 mg/dL (calc) (ref <130)
Total CHOL/HDL Ratio: 2.6 (calc) (ref <5.0)
Triglycerides: 107 mg/dL (ref <150)

## 2020-11-02 LAB — HEMOGLOBIN A1C
Hgb A1c MFr Bld: 5.6 % of total Hgb (ref <5.7)
Mean Plasma Glucose: 114 mg/dL
eAG (mmol/L): 6.3 mmol/L

## 2020-11-02 LAB — ALT: ALT: 11 U/L (ref 6–29)

## 2020-11-02 LAB — AST: AST: 16 U/L (ref 10–35)

## 2020-11-25 ENCOUNTER — Other Ambulatory Visit: Payer: Self-pay | Admitting: Family Medicine

## 2020-11-25 DIAGNOSIS — E785 Hyperlipidemia, unspecified: Secondary | ICD-10-CM

## 2020-12-12 ENCOUNTER — Other Ambulatory Visit: Payer: Self-pay | Admitting: Family Medicine

## 2020-12-12 DIAGNOSIS — E785 Hyperlipidemia, unspecified: Secondary | ICD-10-CM

## 2021-01-14 NOTE — Telephone Encounter (Signed)
Pt had her CPE in February 2022

## 2022-01-28 ENCOUNTER — Ambulatory Visit: Payer: Self-pay | Admitting: Nurse Practitioner

## 2022-02-03 ENCOUNTER — Ambulatory Visit
Admission: EM | Admit: 2022-02-03 | Discharge: 2022-02-03 | Disposition: A | Discharge status: Home / Self Care | Payer: Medicare HMO | Attending: Nurse Practitioner | Admitting: Nurse Practitioner

## 2022-02-03 ENCOUNTER — Ambulatory Visit: Payer: Self-pay

## 2022-02-03 DIAGNOSIS — I10 Essential (primary) hypertension: Secondary | ICD-10-CM | POA: Diagnosis not present

## 2022-02-03 DIAGNOSIS — E785 Hyperlipidemia, unspecified: Secondary | ICD-10-CM | POA: Diagnosis not present

## 2022-02-03 DIAGNOSIS — Z760 Encounter for issue of repeat prescription: Secondary | ICD-10-CM

## 2022-02-03 MED ORDER — SIMVASTATIN 40 MG PO TABS: 40.0000 mg | ORAL_TABLET | Freq: Every day | ORAL | 0 refills | Status: DC

## 2022-02-03 MED ORDER — AMLODIPINE BESYLATE 5 MG PO TABS: 5.0000 mg | ORAL_TABLET | Freq: Every day | ORAL | 0 refills | Status: DC

## 2022-02-03 NOTE — ED Provider Notes (Signed)
[facility]    CSN: [id number] Arrival date & time: 02/03/22  0949      History   Chief Complaint Chief Complaint  Patient presents with   Appointment    1000   Medication Refill         HPI Amber Hampton is a 81 y.o. female.    Medication Refill Patient presents for medication refill for her amlodipine and simvastatin.  Patient states that she has not been taking her medication regularly as she has been afraid she was going to run out.  Patient states she has just recently moved from [place], and has had difficulty finding a primary care provider.  Patient [facility]  Patient denies headache, blurred vision, chest pain, shortness of breath, difficulty breathing, or lower extremity edema. Past Medical History:  Diagnosis Date   ARTHRALGIA 01/12/2008   ASYMPTOMATIC POSTMENOPAUSAL STATUS 01/19/2008   BREAST CANCER, HX OF 06/18/2007   Cancer ([place])    HYPERGLYCEMIA 06/18/2007   HYPERLIPIDEMIA 06/18/2007   HYPERTENSION 06/18/2007   TOBACCO USE, QUIT 01/30/2010   Vertigo     Patient Active Problem List   Diagnosis Date Noted   Eight cranial nerve disorder    Ataxia    Hypocalcemia 12/04/2015   Vertigo 11/28/2015   Benign positional vertigo    SAH (subarachnoid hemorrhage) ([place])    Aneurysm ([place])    Renal failure    Uncontrollable vomiting    Wellness examination 03/22/2015   UTI (urinary tract infection) 03/22/2015   Skin nodule 11/06/2013   Renal insufficiency 10/05/2013   Encounter for long-term (current) use of other medications 02/19/2011   TOBACCO USE, QUIT 01/30/2010   ASYMPTOMATIC POSTMENOPAUSAL STATUS 01/19/2008   ARTHRALGIA 01/12/2008   Hyperlipidemia 06/18/2007   Essential hypertension 06/18/2007   Hyperglycemia 06/18/2007   BREAST CANCER, HX OF 06/18/2007    Past Surgical History:  Procedure Laterality Date   Aneurysm Right Side head  1996   s/p aneurysm clipping (NO MRI)    BREAST LUMPECTOMY Left    malignant   CEREBRAL ANEURYSM REPAIR  1996   clipping (NO MRI)    OB History   No obstetric history on file.      Home Medications    Prior to Admission medications   Medication Sig Start Date End Date Taking? Authorizing Provider  simvastatin (ZOCOR) 40 MG tablet Take 1 tablet (40 mg total) by mouth daily. 02/03/22  Yes Kerianna Rawlinson-Warren, Alda Lea, NP  amLODipine (NORVASC) 5 MG tablet Take 1 tablet (5 mg total) by mouth daily. 02/03/22   Reinette Cuneo-Warren, Alda Lea, NP  ibuprofen (ADVIL,MOTRIN) 200 MG tablet Take 600 mg by mouth every 6 (six) hours as needed for moderate pain.    [provider]    Family History Family History  Problem Relation Age of Onset   Cancer Neg Hx        No FH except for pt    Social History Social History   Tobacco Use   Smoking status: Former    Types: Cigarettes    Quit date: 09/14/2006    Years since quitting: 15.4   Smokeless tobacco: Never  Vaping Use   Vaping Use: Never used  Substance Use Topics   Alcohol use: No   Drug use: No     Allergies   Patient has no known allergies.   Review of Systems Review of Systems PER HPI  Physical Exam Triage Vital Signs  ED Triage Vitals [02/03/22 1031]  Enc Vitals Group     BP (!) 188/95     Pulse Rate 75     Resp 19     Temp 98.1 F (36.7 C)     Temp Source Oral     SpO2 94 %     Weight      Height      Head Circumference      Peak Flow      Pain Score      Pain Loc      Pain Edu?      Excl. in [place]?    No data found.  Updated Vital Signs BP (!) 188/95 (BP Location: Right Arm)   Pulse 75   Temp 98.1 F (36.7 C) (Oral)   Resp 19   SpO2 94%   Visual Acuity Right Eye Distance:   Left Eye Distance:   Bilateral Distance:    Right Eye Near:   Left Eye Near:    Bilateral Near:     Physical Exam Vitals and nursing note reviewed.  Constitutional:      Appearance: Normal appearance.  HENT:     Head: Normocephalic.  Eyes:      Extraocular Movements: Extraocular movements intact.     Pupils: Pupils are equal, round, and reactive to light.  Cardiovascular:     Rate and Rhythm: Normal rate and regular rhythm.     Pulses: Normal pulses.     Heart sounds: Normal heart sounds.  Pulmonary:     Effort: Pulmonary effort is normal.     Breath sounds: Normal breath sounds.  Abdominal:     General: Bowel sounds are normal.     Palpations: Abdomen is soft.  Skin:    General: Skin is warm and dry.  Neurological:     General: No focal deficit present.     Mental Status: She is alert and oriented to person, place, and time.  Psychiatric:        Mood and Affect: Mood normal.        Behavior: Behavior normal.     UC Treatments / Results  Labs (all labs ordered are listed, but only abnormal results are displayed) Labs Reviewed - No data to display  EKG   Radiology No results found.  Procedures Procedures (including critical care time)  Medications Ordered in UC Medications - No data to display  Initial Impression / Assessment and Plan / UC Course  I have reviewed the triage vital signs and the nursing notes.  Pertinent labs & imaging results that were available during my care of the patient were reviewed by me and considered in my medical decision making (see chart for details).  The patient is a 81 year old female who presents for medication refill.  Patient has a history of hyperlipidemia and hypertension.  Patient states that she has not been taking her medication consistently because she is afraid she was going to run out.  Her exam is reassuring, her vital signs are elevated, specifically her blood pressure.  Patient was advised to take medication as soon as possible.  Patient informed to follow-up in the ER if she develops chest pain, shortness of breath, difficulty breathing.  Patient advised to attend her appointment as scheduled in June.  Follow-up as needed. Final Clinical Impressions(s) / UC Diagnoses    Final diagnoses:  Encounter for medication refill  Essential hypertension  Hyperlipidemia, unspecified hyperlipidemia type     Discharge Instructions  Take medication as prescribed. Make sure you attend your appointment as scheduled for June, 2023, with your new primary care office. Follow-up as needed.     ED Prescriptions     Medication Sig Dispense Auth. Provider   amLODipine (NORVASC) 5 MG tablet Take 1 tablet (5 mg total) by mouth daily. 60 tablet Candee Hoon-Warren, Alda Lea, NP   simvastatin (ZOCOR) 40 MG tablet Take 1 tablet (40 mg total) by mouth daily. 60 tablet Naarah Borgerding-Warren, Alda Lea, NP      PDMP not reviewed this encounter.   Tish Men, NP 02/03/22 1041

## 2022-02-03 NOTE — Discharge Instructions (Addendum)
Take medication as prescribed. Make sure you attend your appointment as scheduled for June, 2023, with your new primary care office. Follow-up as needed.

## 2022-02-03 NOTE — ED Triage Notes (Signed)
Pt presents for medication refill.  Amlodipine 5 mg  Simvastatin 40 mg

## 2022-03-04 ENCOUNTER — Encounter: Payer: Self-pay | Admitting: Nurse Practitioner

## 2022-03-04 ENCOUNTER — Ambulatory Visit (INDEPENDENT_AMBULATORY_CARE_PROVIDER_SITE_OTHER): Payer: Medicare HMO | Admitting: Nurse Practitioner

## 2022-03-04 VITALS — BP 142/82 | HR 75 | Temp 97.6°F | Ht 61.0 in | Wt 175.2 lb

## 2022-03-04 DIAGNOSIS — I10 Essential (primary) hypertension: Secondary | ICD-10-CM

## 2022-03-04 DIAGNOSIS — E785 Hyperlipidemia, unspecified: Secondary | ICD-10-CM | POA: Diagnosis not present

## 2022-03-04 DIAGNOSIS — R7309 Other abnormal glucose: Secondary | ICD-10-CM

## 2022-03-04 NOTE — Progress Notes (Signed)
Subjective:    Patient ID: Amber Hampton, female    DOB: 03/09/1941, 81 y.o.   MRN: [id number]  HPI Pt arrives to establish care. Former patient of Dr.Mary Cirigliano at [facility]. No questions or concerns at this time. Pt had lumpectomy on left side. Pt also has history of brain aneurysm.   Patient denies any shortness of breath, chest pain, difficulty breathing, swelling to her legs.  Also denies any headaches, changes to vision.  Patient has no complaints at this time and states that she feels good.   Review of Systems  All other systems reviewed and are negative.      Objective:   Physical Exam Vitals reviewed.  Constitutional:      General: She is not in acute distress.    Appearance: Normal appearance. She is obese. She is not ill-appearing, toxic-appearing or diaphoretic.  HENT:     Head: Normocephalic and atraumatic.  Cardiovascular:     Rate and Rhythm: Normal rate and regular rhythm.     Pulses: Normal pulses.     Heart sounds: Normal heart sounds. No murmur heard. Pulmonary:     Effort: Pulmonary effort is normal. No respiratory distress.     Breath sounds: Normal breath sounds. No wheezing.  Musculoskeletal:     Comments: Grossly intact  Skin:    General: Skin is warm.     Capillary Refill: Capillary refill takes less than 2 seconds.  Neurological:     Mental Status: She is alert.     Comments: Grossly intact  Psychiatric:        Mood and Affect: Mood normal.        Behavior: Behavior normal.        Assessment & Plan:   1. Essential hypertension -Blood pressure today was initially 170/84.  It was 142/82 on recheck.  Goal of blood pressure less than 140/90 almost met. -Patient encouraged to check blood pressure at least once every other day to ensure that blood pressures are not staying in the 170s over 80s.  Return to clinic if she is noticing elevated blood pressures. -Due to history of brain aneurysm it is important to keep  blood pressure lower than 140/90. -He is taking amlodipine 5 mg daily - CMP14+EGFR - Urine Microalbumin w/creat. Ratio -Turn to clinic in 3 months for blood pressure check.  2. Abnormal glucose - HgB A1c  3. Hyperlipidemia, unspecified hyperlipidemia type - Lipid Profile -Patient currently taking simvastatin 40 mg.  Continue taking statin as prescribed -Turn to clinic 3 months   Note:  This document was prepared using Dragon voice recognition software and may include unintentional dictation errors. Note - This record has been created using Dragon software.  Chart creation errors have been sought, but may not always  have been located. Such creation errors do not reflect on  the standard of medical care.   

## 2022-03-05 LAB — LIPID PANEL
Chol/HDL Ratio: 2.1 ratio (ref 0.0–4.4)
Cholesterol, Total: 173 mg/dL (ref 100–199)
HDL: 82 mg/dL (ref >39)
LDL Chol Calc (NIH): 69 mg/dL (ref 0–99)
VLDL Cholesterol Cal: 22 mg/dL (ref 5–40)

## 2022-03-05 LAB — CMP14+EGFR
ALT: 13 IU/L (ref 0–32)
AST: 19 IU/L (ref 0–40)
Albumin/Globulin Ratio: 1.5 (ref 1.2–2.2)
Albumin: 4.5 g/dL (ref 3.7–4.7)
Alkaline Phosphatase: 64 IU/L (ref 44–121)
BUN: 31 mg/dL — ABNORMAL HIGH (ref 8–27)
Bilirubin Total: 0.3 mg/dL (ref 0.0–1.2)
CO2: 21 mmol/L (ref 20–29)
Calcium: 9.8 mg/dL (ref 8.7–10.3)
Chloride: 104 mmol/L (ref 96–106)
Creatinine, Ser: 1.55 mg/dL — ABNORMAL HIGH (ref 0.57–1.00)
Globulin, Total: 3.1 g/dL (ref 1.5–4.5)
Glucose: 91 mg/dL (ref 70–99)
Sodium: 142 mmol/L (ref 134–144)
Total Protein: 7.6 g/dL (ref 6.0–8.5)
eGFR: 34 mL/min/{1.73_m2} — ABNORMAL LOW (ref >59)

## 2022-03-05 LAB — HEMOGLOBIN A1C
Est. average glucose Bld gHb Est-mCnc: 117 mg/dL
Hgb A1c MFr Bld: 5.7 % — ABNORMAL HIGH (ref 4.8–5.6)

## 2022-04-20 ENCOUNTER — Other Ambulatory Visit: Payer: Self-pay | Admitting: *Deleted

## 2022-04-20 DIAGNOSIS — I10 Essential (primary) hypertension: Secondary | ICD-10-CM

## 2022-04-20 MED ORDER — SIMVASTATIN 40 MG PO TABS: 40.0000 mg | ORAL_TABLET | Freq: Every day | ORAL | 0 refills | Status: DC

## 2022-04-20 MED ORDER — AMLODIPINE BESYLATE 5 MG PO TABS: 5.0000 mg | ORAL_TABLET | Freq: Every day | ORAL | 0 refills | Status: DC

## 2022-06-03 ENCOUNTER — Encounter: Payer: Self-pay | Admitting: Nurse Practitioner

## 2022-06-03 ENCOUNTER — Ambulatory Visit (INDEPENDENT_AMBULATORY_CARE_PROVIDER_SITE_OTHER): Payer: Medicare HMO | Admitting: Nurse Practitioner

## 2022-06-03 VITALS — BP 133/83 | Ht 61.0 in | Wt 177.0 lb

## 2022-06-03 DIAGNOSIS — E785 Hyperlipidemia, unspecified: Secondary | ICD-10-CM

## 2022-06-03 DIAGNOSIS — N289 Disorder of kidney and ureter, unspecified: Secondary | ICD-10-CM

## 2022-06-03 DIAGNOSIS — I10 Essential (primary) hypertension: Secondary | ICD-10-CM

## 2022-06-03 MED ORDER — SIMVASTATIN 40 MG PO TABS: 40.0000 mg | ORAL_TABLET | Freq: Every day | ORAL | 3 refills | Status: DC

## 2022-06-03 MED ORDER — AMLODIPINE BESYLATE 5 MG PO TABS: 5.0000 mg | ORAL_TABLET | Freq: Every day | ORAL | 3 refills | Status: DC

## 2022-06-03 NOTE — Progress Notes (Signed)
   Subjective:    Patient ID: Amber Hampton, female    DOB: 04-10-1941, 81 y.o.   MRN: [id number]  Hypertension This is a chronic problem. The current episode started more than 1 year ago. Risk factors for coronary artery disease include dyslipidemia. Treatments tried: norvasc. There are no compliance problems.    81 year old female patient with history of hypertension, subarachnoid hemorrhage, renal insufficiency, and cancer presents to clinic today for follow-up of hyperlipidemia and blood pressure.  Patient continues to take blood pressure medication without difficulty.  Patient continues to take Zocor without difficulty.  Patient has no complaints today   Review of Systems  All other systems reviewed and are negative.      Objective:   Physical Exam Vitals reviewed.  Constitutional:      General: She is not in acute distress.    Appearance: Normal appearance. She is normal weight. She is not ill-appearing, toxic-appearing or diaphoretic.  HENT:     Head: Normocephalic and atraumatic.  Cardiovascular:     Rate and Rhythm: Normal rate and regular rhythm.     Pulses: Normal pulses.     Heart sounds: Normal heart sounds. No murmur heard. Pulmonary:     Effort: Pulmonary effort is normal. No respiratory distress.     Breath sounds: Normal breath sounds. No wheezing.  Musculoskeletal:     Comments: Grossly intact  Skin:    General: Skin is warm.     Capillary Refill: Capillary refill takes less than 2 seconds.  Neurological:     Mental Status: She is alert.     Comments: Grossly intact  Psychiatric:        Mood and Affect: Mood normal.        Behavior: Behavior normal.           Assessment & Plan:   1. Essential hypertension -Blood pressure today 133/88.  Blood pressure goal met. -Continue taking amlodipine 5 mg as prescribed - amLODipine (NORVASC) 5 MG tablet; Take 1 tablet (5 mg total) by mouth daily.  Dispense: 90 tablet; Refill: 3  2. Renal  insufficiency -Last GFR was 31.  Last creatinine was 1.67. -We will repeat lab work as well as get microalbumin ratio - Urine Microalbumin w/creat. ratio - CMP14+EGFR -If any changes to CMP or if microalbumin ratio elevated will refer to nephrology -Refrain from ibuprofen products and other nephrotoxic medications to help protect kidneys -Drink plenty water  3. Hyperlipidemia, unspecified hyperlipidemia type -Last LDL 69 -Hyperlipidemia well-controlled with Zocor -Refill - simvastatin (ZOCOR) 40 MG tablet; Take 1 tablet (40 mg total) by mouth daily.  Dispense: 90 tablet; Refill: 3  -Return to clinic in 1 year or sooner if indicated

## 2022-06-04 ENCOUNTER — Encounter: Payer: Self-pay | Admitting: Nurse Practitioner

## 2022-06-06 LAB — CMP14+EGFR
Albumin: 4.6 g/dL (ref 3.8–4.8)
Calcium: 10.2 mg/dL (ref 8.7–10.3)
Creatinine, Ser: 1.67 mg/dL — ABNORMAL HIGH (ref 0.57–1.00)
Sodium: 140 mmol/L (ref 134–144)

## 2022-06-07 LAB — MICROALBUMIN / CREATININE URINE RATIO

## 2022-06-07 LAB — CMP14+EGFR
ALT: 14 IU/L (ref 0–32)
AST: 18 IU/L (ref 0–40)
Albumin/Globulin Ratio: 1.6 (ref 1.2–2.2)
Alkaline Phosphatase: 67 IU/L (ref 44–121)
BUN/Creatinine Ratio: 19 (ref 12–28)
BUN: 31 mg/dL — ABNORMAL HIGH (ref 8–27)
Bilirubin Total: 0.4 mg/dL (ref 0.0–1.2)
CO2: 23 mmol/L (ref 20–29)
Chloride: 100 mmol/L (ref 96–106)
Globulin, Total: 2.8 g/dL (ref 1.5–4.5)
Glucose: 95 mg/dL (ref 70–99)
Potassium: 5.5 mmol/L — ABNORMAL HIGH (ref 3.5–5.2)
Total Protein: 7.4 g/dL (ref 6.0–8.5)
eGFR: 31 mL/min/{1.73_m2} — ABNORMAL LOW (ref >59)

## 2022-06-07 LAB — SPECIMEN STATUS REPORT

## 2022-06-10 ENCOUNTER — Other Ambulatory Visit: Payer: Self-pay | Admitting: Nurse Practitioner

## 2022-06-10 DIAGNOSIS — N289 Disorder of kidney and ureter, unspecified: Secondary | ICD-10-CM

## 2022-07-07 ENCOUNTER — Emergency Department (HOSPITAL_COMMUNITY)
Admission: EM | Admit: 2022-07-07 | Discharge: 2022-07-07 | Disposition: A | Discharge status: Home / Self Care | Payer: Medicare HMO | Attending: Emergency Medicine | Admitting: Emergency Medicine

## 2022-07-07 ENCOUNTER — Emergency Department (HOSPITAL_COMMUNITY): Payer: Medicare HMO

## 2022-07-07 ENCOUNTER — Other Ambulatory Visit: Payer: Self-pay

## 2022-07-07 ENCOUNTER — Encounter (HOSPITAL_COMMUNITY): Payer: Self-pay | Admitting: Emergency Medicine

## 2022-07-07 DIAGNOSIS — Z79899 Other long term (current) drug therapy: Secondary | ICD-10-CM | POA: Insufficient documentation

## 2022-07-07 DIAGNOSIS — M79605 Pain in left leg: Secondary | ICD-10-CM | POA: Diagnosis not present

## 2022-07-07 DIAGNOSIS — I10 Essential (primary) hypertension: Secondary | ICD-10-CM | POA: Diagnosis not present

## 2022-07-07 NOTE — ED Provider Notes (Signed)
[facility] Provider Note   CSN: [id number] Arrival date & time: 07/07/22  1643     History  Chief Complaint  Patient presents with   Leg Pain    Amber Hampton is a 81 y.o. female.   Leg Pain Associated symptoms: no back pain and no fever          Amber Hampton is a 81 y.o. female with past medical history of hypertension, who presents to the Emergency Department complaining of sudden onset of pain to left lower leg.  States that she walked her dog this morning came back inside and sat down with her legs propped up.  When she went to stand up again she had sharp pain to her left calf that radiated up into her posterior thigh.  She is unable to bear weight to her left leg due to pain.  She denies any chest pain or shortness of breath.  No fall or known injury.  She denies any swelling, discoloration or numbness of her leg.  No back pain.  No history of DVT.     Home Medications Prior to Admission medications   Medication Sig Start Date End Date Taking? Authorizing Provider  amLODipine (NORVASC) 5 MG tablet Take 1 tablet (5 mg total) by mouth daily. 06/03/22   Ameduite, Trenton Gammon, NP  simvastatin (ZOCOR) 40 MG tablet Take 1 tablet (40 mg total) by mouth daily. 06/03/22   Ameduite, Trenton Gammon, NP      Allergies    Patient has no known allergies.    Review of Systems   Review of Systems  Constitutional:  Negative for appetite change and fever.  Respiratory:  Negative for shortness of breath.   Cardiovascular:  Negative for chest pain and leg swelling.  Gastrointestinal:  Negative for abdominal pain, nausea and vomiting.  Genitourinary:  Negative for difficulty urinating, dysuria and flank pain.  Musculoskeletal:  Positive for myalgias (Left lower leg pain). Negative for back pain.  Skin:  Negative for color change, pallor and wound.  Neurological:  Negative for dizziness, weakness and numbness.    Physical Exam Updated Vital Signs BP (!) 191/73    Pulse 63   Temp 97.9 F (36.6 C) (Oral)   Resp 18   SpO2 98%  Physical Exam Vitals and nursing note reviewed.  Constitutional:      General: She is not in acute distress.    Appearance: Normal appearance. She is not ill-appearing or toxic-appearing.  Cardiovascular:     Rate and Rhythm: Normal rate and regular rhythm.     Pulses: Normal pulses.  Pulmonary:     Effort: Pulmonary effort is normal. No respiratory distress.     Breath sounds: Normal breath sounds.  Chest:     Chest wall: No tenderness.  Musculoskeletal:        General: Tenderness present. No swelling or signs of injury.     Left lower leg: No swelling, deformity, tenderness or bony tenderness. No edema.     Comments: Patient has full range of motion of the left knee.  I do not appreciate any erythema, excessive warmth or edema of the left lower extremity.  Calf is soft and nontender to palpation.  Achilles tendon is intact.  Negative Thompson's test.  Negative Homans' sign.  Skin:    General: Skin is warm.     Capillary Refill: Capillary refill takes less than 2 seconds.     Coloration: Skin is not pale.  Findings: No bruising or erythema.  Neurological:     General: No focal deficit present.     Mental Status: She is alert.     Sensory: No sensory deficit.     Motor: No weakness.     ED Results / Procedures / Treatments   Labs (all labs ordered are listed, but only abnormal results are displayed) Labs Reviewed - No data to display  EKG None  Radiology US Venous Img Lower Unilateral Left  Result Date: 07/07/2022 CLINICAL DATA:  left leg pain EXAM: LEFT LOWER EXTREMITY VENOUS DOPPLER ULTRASOUND TECHNIQUE: Gray-scale sonography with compression, as well as color and duplex ultrasound, were performed to evaluate the deep venous system(s) from the level of the common femoral vein through the popliteal and proximal calf veins. COMPARISON:  None Available. FINDINGS: VENOUS Normal compressibility of the common  femoral, superficial femoral, and popliteal veins, as well as the visualized calf veins. Visualized portions of profunda femoral vein and great saphenous vein unremarkable. No filling defects to suggest DVT on grayscale or color Doppler imaging. Doppler waveforms show normal direction of venous flow, normal respiratory plasticity and response to augmentation. Limited views of the contralateral common femoral vein are unremarkable. IMPRESSION: No evidence of DVT in the left lower extremity. Electronically Signed   By: Margaretha Sheffield M.D.   On: 07/07/2022 17:52    Procedures Procedures    Medications Ordered in ED Medications - No data to display  ED Course/ Medical Decision Making/ A&P                           Medical Decision Making Patient here with sudden onset of left lower leg pain.  Walked her dog earlier today without difficulty.  No known fall.  She came back inside the house and sat down for some time with her legs elevated.  When she attempted to stand again she had significant pain in her left lower leg and unable to bear weight.  Pain is only with attempted weightbearing, improves at rest.  On exam, patient well-appearing nontoxic.  Vital signs reviewed.  She is hypertensive with history of hypertension.  Denies any missed doses of her medication but states that she feels anxious.  No associated symptoms.  On exam of the left lower extremity I do not appreciate any edema, erythema or excessive warmth.  There is no palpable cord and no tenderness of the calf.  Dorsalis pedis and posterior tibial pulses are palpable.  I do not appreciate any tendon or muscle deformity of the extremity.  She does not endorse any tear or popping sensation of the leg.  She is able to ambulate with a walker  Differential would include but not limited to DVT, cellulitis, musculoskeletal injury, muscle tear.  She does not have any tenderness of the lower leg to palpation or deformity to suggest muscle tear,  there is no erythema or excessive warmth.  Clinically my suspicion for cellulitis or DVT is low.  Given patient's age I feel that is reasonable to obtain ultrasound imaging of the extremity.  Amount and/or Complexity of Data Reviewed Radiology: ordered.    Details: Venous ultrasound of the left lower extremity without evidence of DVT. Discussion of management or test interpretation with external provider(s): Discussed findings with the patient.  She has a walker at home.  I feel her symptoms are likely musculoskeletal.  She is agreeable to symptomatic treatment, will provide Ace wrap for support and she  was given instructions for proper use and wear.  Recommended Tylenol for pain.  She will follow-up closely with PCP later this week.  Strict return precautions were discussed.  Agreeable to have blood pressure rechecked by PCP later this week as well.           Final Clinical Impression(s) / ED Diagnoses Final diagnoses:  Left leg pain    Rx / DC Orders ED Discharge Orders     None         Kem Parkinson, Hershal Coria 07/07/22 1907    Wyvonnia Dusky, MD 07/08/22 [phone]

## 2022-07-07 NOTE — ED Triage Notes (Signed)
Pt c/o being unable to bare weight on left leg today. Woke up fine. Pt c/o pain to left calf. No warmth/swelling noted. Denies sob. No discoloration noted. Dorsal pedis pulses present

## 2022-07-07 NOTE — Discharge Instructions (Signed)
The ultrasound of your left leg was negative for a blood clot.  Your symptoms may be related to a muscle strain.  You may wear the Ace wrap as needed for support when standing or walking.  Do not wear continuously and remove for bathing, bedtime and while at rest.  You may take Tylenol if needed for pain.  Follow-up with your primary care provider this week for recheck and to also recheck your blood pressure.  Return to the emergency department for any new or worsening symptoms.

## 2022-07-23 ENCOUNTER — Telehealth: Payer: Self-pay

## 2022-07-23 NOTE — Telephone Encounter (Signed)
     Patient  visit on 07/07/22  at [facility] was for pain in leg.  Have you been able to follow up with your primary care physician? Patient is better and did not need to follow up with her PCP.  The patient was or was not able to obtain any needed medicine or equipment. No medications prescribed.  Are there diet recommendations that you are having difficulty following? No  Patient expresses understanding of discharge instructions and education provided has no other needs at this time.    [place] [facility] Guide   ??[email or web address]  ?? [phone]   Website: [email or web address]  [email or web address]

## 2023-03-12 ENCOUNTER — Ambulatory Visit (INDEPENDENT_AMBULATORY_CARE_PROVIDER_SITE_OTHER): Payer: Medicare HMO

## 2023-03-12 VITALS — Ht 64.5 in | Wt 180.0 lb

## 2023-03-12 DIAGNOSIS — Z0000 Encounter for general adult medical examination without abnormal findings: Secondary | ICD-10-CM

## 2023-03-12 NOTE — Patient Instructions (Signed)
Amber Hampton , Thank you for taking time to come for your Medicare Wellness Visit. I appreciate your ongoing commitment to your health goals. Please review the following plan we discussed and let me know if I can assist you in the future.   These are the goals we discussed:  Goals      Patient Stated     Patient wants to remain as active and healthy as she currently is.         This is a list of the screening recommended for you and due dates:  Health Maintenance  Topic Date Due   Medicare Annual Wellness Visit  Never done   Zoster (Shingles) Vaccine (1 of 2) Never done   DTaP/Tdap/Td vaccine (2 - Tdap) 01/22/2019   COVID-19 Vaccine (1) 07/12/2020   Flu Shot  04/15/2023   Pneumonia Vaccine  Completed   DEXA scan (bone density measurement)  Completed   HPV Vaccine  Aged Out    Advanced directives: Advance directive discussed with you today. Even though you declined this today, please call our office should you change your mind, and we can give you the proper paperwork for you to fill out. Advance care planning is a way to make decisions about medical care that fits your values in case you are ever unable to make these decisions for yourself.  Information on Advanced Care Planning can be found at [facility] Advance Health Care Directives Advance Health Care Directives ([email or web address]    Conditions/risks identified:  You are due for the vaccines checked below. You may have these done at your preferred pharmacy. Please have them fax the office proof of the vaccines so that we can update your chart.   [x]  Shingrix (Shingles vaccine) []  Pneumonia Vaccines [x]  TDAP (Tetanus) Vaccine [x]  Covid-19   Next appointment: VIRTUAL/TELEPHONE APPOINTMENT Follow up in one year for your annual wellness visit  March 24, 2024 at 930am telephone visit   Preventive Care 65 Years and Older, Female Preventive care refers to lifestyle choices and visits with your health care provider  that can promote health and wellness. What does preventive care include? A yearly physical exam. This is also called an annual well check. Dental exams once or twice a year. Routine eye exams. Ask your health care provider how often you should have your eyes checked. Personal lifestyle choices, including: Daily care of your teeth and gums. Regular physical activity. Eating a healthy diet. Avoiding tobacco and drug use. Limiting alcohol use. Practicing safe sex. Taking low-dose aspirin every day. Taking vitamin and mineral supplements as recommended by your health care provider. What happens during an annual well check? The services and screenings done by your health care provider during your annual well check will depend on your age, overall health, lifestyle risk factors, and family history of disease. Counseling  Your health care provider may ask you questions about your: Alcohol use. Tobacco use. Drug use. Emotional well-being. Home and relationship well-being. Sexual activity. Eating habits. History of falls. Memory and ability to understand (cognition). Work and work Astronomer. Reproductive health. Screening  You may have the following tests or measurements: Height, weight, and BMI. Blood pressure. Lipid and cholesterol levels. These may be checked every 5 years, or more frequently if you are over 18 years old. Skin check. Lung cancer screening. You may have this screening every year starting at age 71 if you have a 30-pack-year history of smoking and currently smoke or have quit within the past  15 years. Fecal occult blood test (FOBT) of the stool. You may have this test every year starting at age 84. Flexible sigmoidoscopy or colonoscopy. You may have a sigmoidoscopy every 5 years or a colonoscopy every 10 years starting at age 73. Hepatitis C blood test. Hepatitis B blood test. Sexually transmitted disease (STD) testing. Diabetes screening. This is done by checking  your blood sugar (glucose) after you have not eaten for a while (fasting). You may have this done every 1-3 years. Bone density scan. This is done to screen for osteoporosis. You may have this done starting at age 52. Mammogram. This may be done every 1-2 years. Talk to your health care provider about how often you should have regular mammograms. Talk with your health care provider about your test results, treatment options, and if necessary, the need for more tests. Vaccines  Your health care provider may recommend certain vaccines, such as: Influenza vaccine. This is recommended every year. Tetanus, diphtheria, and acellular pertussis (Tdap, Td) vaccine. You may need a Td booster every 10 years. Zoster vaccine. You may need this after age 2. Pneumococcal 13-valent conjugate (PCV13) vaccine. One dose is recommended after age 87. Pneumococcal polysaccharide (PPSV23) vaccine. One dose is recommended after age 69. Talk to your health care provider about which screenings and vaccines you need and how often you need them. This information is not intended to replace advice given to you by your health care provider. Make sure you discuss any questions you have with your health care provider. Document Released: 09/27/2015 Document Revised: 05/20/2016 Document Reviewed: 07/02/2015 Elsevier Interactive Patient Education  2017 [employer].  Fall Prevention in the Home Falls can cause injuries. They can happen to people of all ages. There are many things you can do to make your home safe and to help prevent falls. What can I do on the outside of my home? Regularly fix the edges of walkways and driveways and fix any cracks. Remove anything that might make you trip as you walk through a door, such as a raised step or threshold. Trim any bushes or trees on the path to your home. Use bright outdoor lighting. Clear any walking paths of anything that might make someone trip, such as rocks or  tools. Regularly check to see if handrails are loose or broken. Make sure that both sides of any steps have handrails. Any raised decks and porches should have guardrails on the edges. Have any leaves, snow, or ice cleared regularly. Use sand or salt on walking paths during winter. Clean up any spills in your garage right away. This includes oil or grease spills. What can I do in the bathroom? Use night lights. Install grab bars by the toilet and in the tub and shower. Do not use towel bars as grab bars. Use non-skid mats or decals in the tub or shower. If you need to sit down in the shower, use a plastic, non-slip stool. Keep the floor dry. Clean up any water that spills on the floor as soon as it happens. Remove soap buildup in the tub or shower regularly. Attach bath mats securely with double-sided non-slip rug tape. Do not have throw rugs and other things on the floor that can make you trip. What can I do in the bedroom? Use night lights. Make sure that you have a light by your bed that is easy to reach. Do not use any sheets or blankets that are too big for your bed. They should not hang  down onto the floor. Have a firm chair that has side arms. You can use this for support while you get dressed. Do not have throw rugs and other things on the floor that can make you trip. What can I do in the kitchen? Clean up any spills right away. Avoid walking on wet floors. Keep items that you use a lot in easy-to-reach places. If you need to reach something above you, use a strong step stool that has a grab bar. Keep electrical cords out of the way. Do not use floor polish or wax that makes floors slippery. If you must use wax, use non-skid floor wax. Do not have throw rugs and other things on the floor that can make you trip. What can I do with my stairs? Do not leave any items on the stairs. Make sure that there are handrails on both sides of the stairs and use them. Fix handrails that are  broken or loose. Make sure that handrails are as long as the stairways. Check any carpeting to make sure that it is firmly attached to the stairs. Fix any carpet that is loose or worn. Avoid having throw rugs at the top or bottom of the stairs. If you do have throw rugs, attach them to the floor with carpet tape. Make sure that you have a light switch at the top of the stairs and the bottom of the stairs. If you do not have them, ask someone to add them for you. What else can I do to help prevent falls? Wear shoes that: Do not have high heels. Have rubber bottoms. Are comfortable and fit you well. Are closed at the toe. Do not wear sandals. If you use a stepladder: Make sure that it is fully opened. Do not climb a closed stepladder. Make sure that both sides of the stepladder are locked into place. Ask someone to hold it for you, if possible. Clearly mark and make sure that you can see: Any grab bars or handrails. First and last steps. Where the edge of each step is. Use tools that help you move around (mobility aids) if they are needed. These include: Canes. Walkers. Scooters. Crutches. Turn on the lights when you go into a dark area. Replace any light bulbs as soon as they burn out. Set up your furniture so you have a clear path. Avoid moving your furniture around. If any of your floors are uneven, fix them. If there are any pets around you, be aware of where they are. Review your medicines with your doctor. Some medicines can make you feel dizzy. This can increase your chance of falling. Ask your doctor what other things that you can do to help prevent falls. This information is not intended to replace advice given to you by your health care provider. Make sure you discuss any questions you have with your health care provider. Document Released: 06/27/2009 Document Revised: 02/06/2016 Document Reviewed: 10/05/2014 Elsevier Interactive Patient Education  2017 [employer].

## 2023-03-12 NOTE — Progress Notes (Signed)
 Subjective:   Amber Hampton is a 82 y.o. female who presents for an Initial Medicare Annual Wellness Visit.  Visit Complete: Virtual  I connected with  Amber Hampton on 03/12/23 by a audio enabled telemedicine application and verified that I am speaking with the correct person using two identifiers.  Patient Location: Home  Provider Location: Home Office  I discussed the limitations of evaluation and management by telemedicine. The patient expressed understanding and agreed to proceed.  Patient Medicare AWV questionnaire was completed by the patient on n/a; I have confirmed that all information answered by patient is correct and no changes since this date.  Review of Systems     Cardiac Risk Factors include: advanced age (>24men, >60 women);dyslipidemia;hypertension;obesity (BMI >30kg/m2)     Objective:    Today's Vitals   03/12/23 1003  Weight: 180 lb (81.6 kg)  Height: 5' 4.5" (1.638 m)   Body mass index is 30.42 kg/m.     03/12/2023   10:03 AM 07/07/2022    5:03 PM 09/07/2018    1:12 PM 12/04/2015   11:25 AM 12/03/2015    8:44 AM 11/28/2015    5:34 PM 11/28/2015    2:12 AM  Advanced Directives  Does Patient Have a Medical Advance Directive? No No Yes No No No No  Type of Advance Directive   Living will;Healthcare Power of Attorney      Would patient like information on creating a medical advance directive? No - Patient declined    No - patient declined information No - patient declined information Yes - Educational materials given    Current Medications (verified) Outpatient Encounter Medications as of 03/12/2023  Medication Sig   amLODipine (NORVASC) 5 MG tablet Take 1 tablet (5 mg total) by mouth daily.   simvastatin (ZOCOR) 40 MG tablet Take 1 tablet (40 mg total) by mouth daily.   No facility-administered encounter medications on file as of 03/12/2023.    Allergies (verified) Patient has no known allergies.   History: Past Medical History:  Diagnosis Date    ARTHRALGIA 01/12/2008   ASYMPTOMATIC POSTMENOPAUSAL STATUS 01/19/2008   BREAST CANCER, HX OF 06/18/2007   Cancer (HCC)    HYPERGLYCEMIA 06/18/2007   HYPERLIPIDEMIA 06/18/2007   HYPERTENSION 06/18/2007   TOBACCO USE, QUIT 01/30/2010   Vertigo    Past Surgical History:  Procedure Laterality Date   Aneurysm Right Side head  1996   s/p aneurysm clipping (NO MRI)   BREAST LUMPECTOMY Left    malignant   CEREBRAL ANEURYSM REPAIR  1996   clipping (NO MRI)   Family History  Problem Relation Age of Onset   Cancer Neg Hx        No FH except for pt   Social History   Socioeconomic History   Marital status: Married    Spouse name: Not on file   Number of children: Not on file   Years of education: Not on file   Highest education level: Not on file  Occupational History   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  Tobacco Use   Smoking status: Former    Types: Cigarettes    Quit date: 09/14/2006    Years since quitting: 16.5   Smokeless tobacco: Never  Vaping Use   Vaping Use: Never used  Substance and Sexual Activity   Alcohol use: No   Drug use: No   Sexual activity: Yes  Other Topics Concern   Not on file  Social  History Narrative   Not on file   Social Determinants of Health   Financial Resource Strain: Low Risk  (03/12/2023)   Overall Financial Resource Strain (CARDIA)    Difficulty of Paying Living Expenses: Not hard at all  Food Insecurity: No Food Insecurity (03/12/2023)   Hunger Vital Sign    Worried About Running Out of Food in the Last Year: Never true    Ran Out of Food in the Last Year: Never true  Transportation Needs: No Transportation Needs (03/12/2023)   PRAPARE - Administrator, Civil Service (Medical): No    Lack of Transportation (Non-Medical): No  Physical Activity: Sufficiently Active (03/12/2023)   Exercise Vital Sign    Days of Exercise per Week: 7 days    Minutes of Exercise per Session: 30 min  Stress: No Stress  Concern Present (03/12/2023)   [employer] of Occupational Health - Occupational Stress Questionnaire    Feeling of Stress : Not at all  Social Connections: Moderately Integrated (03/12/2023)   Social Connection and Isolation Panel [NHANES]    Frequency of Communication with Friends and Family: More than three times a week    Frequency of Social Gatherings with Friends and Family: More than three times a week    Attends Religious Services: More than 4 times per year    Active Member of [employer] or Organizations: No    Attends Engineer, structural: Never    Marital Status: Married    Tobacco Counseling Counseling given: Yes   Clinical Intake:  Pre-visit preparation completed: Yes  Pain : No/denies pain     BMI - recorded: 30.42 Nutritional Status: BMI > 30  Obese Nutritional Risks: None Diabetes: No  How often do you need to have someone help you when you read instructions, pamphlets, or other written materials from your doctor or pharmacy?: 1 - Never  Interpreter Needed?: No  Information entered by ::  Daijah Scrivens, CMA   Activities of Daily Living    03/12/2023   10:03 AM  In your present state of health, do you have any difficulty performing the following activities:  Hearing? 0  Vision? 0  Difficulty concentrating or making decisions? 0  Walking or climbing stairs? 0  Dressing or bathing? 0  Doing errands, shopping? 0  Preparing Food and eating ? N  Using the Toilet? N  In the past six months, have you accidently leaked urine? N  Do you have problems with loss of bowel control? N  Managing your Medications? N  Managing your Finances? N  Housekeeping or managing your Housekeeping? N    Patient Care Team: [facility], Alvino Chapel, FNP (Inactive) as PCP - General (Nurse Practitioner) Albin Felling, OD (Optometry)  Indicate any recent Medical Services you may have received from other than [facility] providers in the past year (date may be approximate).      Assessment:   This is a routine wellness examination for [facility].  Hearing/Vision screen Hearing Screening - Comments:: Patient denies any hearing difficulties.    Dietary issues and exercise activities discussed:     Goals Addressed             This Visit's Progress    Patient Stated       Patient wants to remain as active and healthy as she currently is.        Depression Screen    03/12/2023   10:03 AM 03/04/2022    1:08 PM 11/01/2020    1:46  PM  PHQ 2/9 Scores  PHQ - 2 Score 0 0 0    Fall Risk    03/12/2023   10:03 AM 03/04/2022    1:07 PM  Fall Risk   Falls in the past year? 0 0  Number falls in past yr: 0 0  Injury with Fall? 0 0  Risk for fall due to : No Fall Risks No Fall Risks  Follow up Falls prevention discussed Falls evaluation completed    MEDICARE RISK AT HOME:  Medicare Risk at Home - 03/12/23 1005     Any stairs in or around the home? No    If so, are there any without handrails? No    Home free of loose throw rugs in walkways, pet beds, electrical cords, etc? Yes    Adequate lighting in your home to reduce risk of falls? Yes    Life alert? No    Use of a cane, walker or w/c? Yes    Grab bars in the bathroom? No    Shower chair or bench in shower? No    Elevated toilet seat or a handicapped toilet? No             TIMED UP AND GO:  Was the test performed? No    Cognitive Function:        03/12/2023   10:05 AM  6CIT Screen  What Year? 0 points  What month? 0 points  What time? 0 points  Count back from 20 0 points  Months in reverse 4 points  Repeat phrase 2 points  Total Score 6 points    Immunizations Immunization History  Administered Date(s) Administered   Influenza, High Dose Seasonal PF 06/22/2017   Influenza-Unspecified 06/05/2016, 07/12/2020   PFIZER SARS-COV-2 Pediatric Vaccination 5-33yrs 10/06/2019, 11/03/2019, 07/12/2020   Pneumococcal Conjugate-13 03/22/2015   Pneumococcal Polysaccharide-23 01/19/2008    Td 01/21/2009   Zoster, Live 09/14/2012    TDAP status: Due, Education has been provided regarding the importance of this vaccine. Advised may receive this vaccine at local pharmacy or Health Dept. Aware to provide a copy of the vaccination record if obtained from local pharmacy or Health Dept. Verbalized acceptance and understanding.  Flu Vaccine status: Up to date  Pneumococcal vaccine status: Up to date  Covid-19 vaccine status: Information provided on how to obtain vaccines.   Qualifies for Shingles Vaccine? Yes   Zostavax completed No   Shingrix Completed?: No.    Education has been provided regarding the importance of this vaccine. Patient has been advised to call insurance company to determine out of pocket expense if they have not yet received this vaccine. Advised may also receive vaccine at local pharmacy or Health Dept. Verbalized acceptance and understanding.  Screening Tests Health Maintenance  Topic Date Due   Medicare Annual Wellness (AWV)  Never done   Zoster Vaccines- Shingrix (1 of 2) Never done   DTaP/Tdap/Td (2 - Tdap) 01/22/2019   COVID-19 Vaccine (1) 07/12/2020   INFLUENZA VACCINE  04/15/2023   Pneumonia Vaccine 50+ Years old  Completed   DEXA SCAN  Completed   HPV VACCINES  Aged Out    Health Maintenance  Health Maintenance Due  Topic Date Due   Medicare Annual Wellness (AWV)  Never done   Zoster Vaccines- Shingrix (1 of 2) Never done   DTaP/Tdap/Td (2 - Tdap) 01/22/2019   COVID-19 Vaccine (1) 07/12/2020    Colorectal cancer screening: No longer required.   Mammogram status: No longer required due  to age.  Bone Density Scanning: Patient declined  Lung Cancer Screening: (Low Dose CT Chest recommended if Age 68-80 years, 20 pack-year currently smoking OR have quit w/in 15years.) does not qualify.   Additional Screening:  Hepatitis C Screening: does not qualify;  Vision Screening: Recommended annual ophthalmology exams for early detection of  glaucoma and other disorders of the eye. Is the patient up to date with their annual eye exam?  No  Who is the provider or what is the name of the office in which the patient attends annual eye exams? Per patient no vision difficulties If pt is not established with a provider, would they like to be referred to a provider to establish care? No .   Dental Screening: Recommended annual dental exams for proper oral hygiene  Diabetic Foot Exam: n/a  Community Resource Referral / Chronic Care Management: CRR required this visit?  No   CCM required this visit?  No     Plan:     I have personally reviewed and noted the following in the patient's chart:   Medical and social history Use of alcohol, tobacco or illicit drugs  Current medications and supplements including opioid prescriptions. Patient is not currently taking opioid prescriptions. Functional ability and status Nutritional status Physical activity Advanced directives List of other physicians Hospitalizations, surgeries, and ER visits in previous 12 months Vitals Screenings to include cognitive, depression, and falls Referrals and appointments  In addition, I have reviewed and discussed with patient certain preventive protocols, quality metrics, and best practice recommendations. A written personalized care plan for preventive services as well as general preventive health recommendations were provided to patient.   Because this visit was a virtual/telehealth visit,  certain criteria was not obtained, such a blood pressure, CBG if patient is a diabetic, and timed up and go.    Jordan Hawks Noele Icenhour, CMA   03/12/2023   After Visit Summary: (Mail) Due to this being a telephonic visit, the after visit summary with patients personalized plan was offered to patient via mail   Nurse Notes: patient declined Dexa Scan. She needs to establish care with provider. Former patient of [place] [facility]

## 2023-04-06 ENCOUNTER — Ambulatory Visit: Payer: Medicare HMO | Admitting: Family Medicine

## 2023-07-02 ENCOUNTER — Ambulatory Visit: Payer: Medicare HMO | Admitting: Family Medicine

## 2023-07-19 ENCOUNTER — Encounter: Payer: Self-pay | Admitting: Family Medicine

## 2023-07-19 ENCOUNTER — Ambulatory Visit (INDEPENDENT_AMBULATORY_CARE_PROVIDER_SITE_OTHER): Payer: Medicare HMO | Admitting: Family Medicine

## 2023-07-19 VITALS — BP 138/77 | HR 86 | Temp 97.7°F | Ht 64.5 in | Wt 179.0 lb

## 2023-07-19 DIAGNOSIS — M79604 Pain in right leg: Secondary | ICD-10-CM | POA: Diagnosis not present

## 2023-07-19 DIAGNOSIS — R2689 Other abnormalities of gait and mobility: Secondary | ICD-10-CM | POA: Diagnosis not present

## 2023-07-19 DIAGNOSIS — I10 Essential (primary) hypertension: Secondary | ICD-10-CM | POA: Diagnosis not present

## 2023-07-19 DIAGNOSIS — R7303 Prediabetes: Secondary | ICD-10-CM | POA: Diagnosis not present

## 2023-07-19 DIAGNOSIS — N1832 Chronic kidney disease, stage 3b: Secondary | ICD-10-CM | POA: Diagnosis not present

## 2023-07-19 DIAGNOSIS — N183 Chronic kidney disease, stage 3 unspecified: Secondary | ICD-10-CM | POA: Insufficient documentation

## 2023-07-19 DIAGNOSIS — E785 Hyperlipidemia, unspecified: Secondary | ICD-10-CM | POA: Diagnosis not present

## 2023-07-19 DIAGNOSIS — Z8679 Personal history of other diseases of the circulatory system: Secondary | ICD-10-CM | POA: Insufficient documentation

## 2023-07-19 MED ORDER — SIMVASTATIN 40 MG PO TABS: 40.0000 mg | ORAL_TABLET | Freq: Every day | ORAL | 3 refills | Status: DC

## 2023-07-19 MED ORDER — AMLODIPINE BESYLATE 5 MG PO TABS: 5.0000 mg | ORAL_TABLET | Freq: Every day | ORAL | 3 refills | Status: DC

## 2023-07-19 NOTE — Progress Notes (Signed)
Subjective:  Patient ID: Amber Hampton, female    DOB: [birth year]  Age: 82 y.o. MRN: [id number]  CC:   Chief Complaint  Patient presents with   Establish Care   request  for license plate placard    Related to hx of vertigo and balance issues    HPI:  82 year old female presents to establish care with me.  Patient has underlying CKD, hyperlipidemia, hypertension.  Has a history of breast cancer.  Has had a previous aneurysm rupture with subarachnoid hemorrhage.   Patient states that overall she is doing okay.  Patient reports that she has ongoing issues with balance.  She states that this has been going on for the past 4 to 5 years.  She states that this started after she had a bout of vertigo.  She has had no recent workup or intervention regarding this.  Patient states that she does not want to do anything about this at this time.  He does want a handicap placard.  Patient also reports ongoing pain in her right lower extremity.  Suspected lumbar radiculopathy.  Patient states that she is taking ibuprofen regularly.  I have advised her that she should not do this due to underlying renal insufficiency.  Tylenol is okay to use.  Once again, patient declines any workup or other intervention at this time.  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  Tobacco Use   Smoking status: Former     Current packs/day: 0.00    Types: Cigarettes    Quit date: 09/14/2006    Years since quitting: 16.8   Smokeless tobacco: Never  Vaping Use   Vaping status: Never Used  Substance and Sexual Activity   Alcohol use: No   Drug use: No   Sexual activity: Yes  Other Topics Concern   Not on file  Social History Narrative   Not on file   Social Determinants of Health   Financial Resource Strain: Low Risk  (03/12/2023)   Overall Financial Resource Strain (CARDIA)    Difficulty of Paying Living Expenses: Not hard at all  Food Insecurity: No Food Insecurity (03/12/2023)   Hunger Vital Sign    Worried About Running Out of Food in the Last Year: Never true    Ran Out of Food in the Last Year: Never true  Transportation Needs: No Transportation Needs (03/12/2023)   PRAPARE - Administrator, Civil Service (Medical): No    Lack of Transportation (Non-Medical): No  Physical Activity: Sufficiently Active (03/12/2023)   Exercise Vital Sign    Days of Exercise per Week: 7 days    Minutes of Exercise per Session: 30 min  Stress: No Stress Concern Present (03/12/2023)   [employer] of Occupational Health - Occupational Stress Questionnaire    Feeling of Stress : Not at all  Social Connections: Moderately Integrated (03/12/2023)   Social Connection and Isolation Panel [NHANES]    Frequency of Communication with Friends and Family: More than three times a week    Frequency of Social Gatherings with Friends and Family: More than three times a week    Attends Religious Services: More than 4 times per year    Active Member of [employer] or Organizations: No    Attends Engineer, structural: Never    Marital Status: Married    Review of Systems Per HPI  Objective:  BP 138/77   Pulse 86   Temp 97.7 F (36.5 C)   Ht 5' 4.5" (1.638 m)   Wt 179 lb (81.2 kg)   SpO2 97%   BMI 30.25 kg/m      07/19/2023    9:42 AM 07/19/2023    9:19 AM 03/12/2023   10:03 AM  BP/Weight   Systolic BP 138 149 --  Diastolic BP 77 85 --  Wt. (Lbs)  179 180  BMI  30.25 kg/m2 30.42 kg/m2    Physical Exam Vitals and nursing note reviewed.  Constitutional:      General: She is not in acute distress.    Appearance: Normal appearance.  HENT:     Head: Normocephalic and atraumatic.  Cardiovascular:     Rate and Rhythm: Normal rate and regular rhythm.  Pulmonary:     Effort: Pulmonary effort is normal.     Breath sounds: Normal breath sounds. No wheezing, rhonchi or rales.  Neurological:     Mental Status: She is alert. Mental status is at baseline.  Psychiatric:        Mood and Affect: Mood normal.        Behavior: Behavior normal.     Lab Results  Component Value Date   WBC 6.8 11/01/2020   HGB 14.6 11/01/2020   HCT 44.7 11/01/2020   PLT 221 11/01/2020   GLUCOSE 95 06/03/2022   CHOL 173 03/04/2022   TRIG 133 03/04/2022   HDL 82 03/04/2022   LDLDIRECT 143.6 01/21/2009   LDLCALC 69 03/04/2022   ALT 14 06/03/2022   AST 18 06/03/2022   NA 140 06/03/2022   K 5.5 (H) 06/03/2022   CL 100 06/03/2022   CREATININE 1.67 (H) 06/03/2022   BUN 31 (H) 06/03/2022   CO2 23 06/03/2022   TSH 1.18 03/22/2015   HGBA1C 5.7 (H) 03/04/2022     Assessment & Plan:   Problem List Items Addressed This Visit       Cardiovascular and Mediastinum   Essential hypertension - Primary    Stable on amlodipine.  Continue.  Refilled today.  Labs ordered.      Relevant Medications   simvastatin (ZOCOR) 40 MG tablet   amLODipine (NORVASC) 5 MG tablet     Genitourinary   CKD (chronic kidney disease) stage 3, GFR 30-59 ml/min (HCC)    Metabolic panel today.      Relevant Orders   CBC   CMP14+EGFR   Microalbumin / creatinine urine ratio     Other   Right leg pain    Will continue to monitor.  Patient declines intervention at this time.  No NSAID use.      Hyperlipidemia    Last LDL was 69.  Continue Zocor.  Lipid panel today.      Relevant Medications   simvastatin  (ZOCOR) 40 MG tablet   amLODipine (NORVASC) 5 MG tablet   Other Relevant Orders   Lipid panel  Balance problem    Patient declines workup or other intervention at this time.      Other Visit Diagnoses     Prediabetes       Relevant Orders   Hemoglobin A1c       Meds ordered this encounter  Medications   simvastatin (ZOCOR) 40 MG tablet    Sig: Take 1 tablet (40 mg total) by mouth daily.    Dispense:  90 tablet    Refill:  3   amLODipine (NORVASC) 5 MG tablet    Sig: Take 1 tablet (5 mg total) by mouth daily.    Dispense:  90 tablet    Refill:  3    Follow-up:  Return in about 6 months (around 01/16/2024).  Everlene Other DO [facility]

## 2023-07-19 NOTE — Assessment & Plan Note (Signed)
Stable on amlodipine.  Continue.  Refilled today.  Labs ordered.

## 2023-07-19 NOTE — Patient Instructions (Signed)
Labs today. We will call with results.  Follow up in 6 months,  Call with concerns.  Take care  Dr. Adriana Simas

## 2023-07-19 NOTE — Assessment & Plan Note (Signed)
Will continue to monitor.  Patient declines intervention at this time.  No NSAID use.

## 2023-07-19 NOTE — Assessment & Plan Note (Signed)
Last LDL was 69.  Continue Zocor.  Lipid panel today.

## 2023-07-19 NOTE — Assessment & Plan Note (Signed)
Metabolic panel today. 

## 2023-07-19 NOTE — Assessment & Plan Note (Signed)
Patient declines workup or other intervention at this time.

## 2023-07-21 ENCOUNTER — Other Ambulatory Visit: Payer: Self-pay

## 2023-07-21 DIAGNOSIS — N184 Chronic kidney disease, stage 4 (severe): Secondary | ICD-10-CM

## 2023-07-21 DIAGNOSIS — N1832 Chronic kidney disease, stage 3b: Secondary | ICD-10-CM

## 2023-07-21 LAB — LIPID PANEL
Chol/HDL Ratio: 1.8 ratio (ref 0.0–4.4)
Cholesterol, Total: 151 mg/dL (ref 100–199)
HDL: 82 mg/dL (ref >39)
LDL Chol Calc (NIH): 50 mg/dL (ref 0–99)
Triglycerides: 106 mg/dL (ref 0–149)
VLDL Cholesterol Cal: 19 mg/dL (ref 5–40)

## 2023-07-21 LAB — CMP14+EGFR
ALT: 11 IU/L (ref 0–32)
AST: 17 IU/L (ref 0–40)
Albumin: 4.4 g/dL (ref 3.7–4.7)
Alkaline Phosphatase: 72 [IU]/L (ref 44–121)
BUN/Creatinine Ratio: 13 (ref 12–28)
BUN: 23 mg/dL (ref 8–27)
Bilirubin Total: 0.4 mg/dL (ref 0.0–1.2)
CO2: 21 mmol/L (ref 20–29)
Calcium: 9.8 mg/dL (ref 8.7–10.3)
Chloride: 104 mmol/L (ref 96–106)
Creatinine, Ser: 1.8 mg/dL — ABNORMAL HIGH (ref 0.57–1.00)
Globulin, Total: 2.9 g/dL (ref 1.5–4.5)
Glucose: 86 mg/dL (ref 70–99)
Potassium: 5.4 mmol/L — ABNORMAL HIGH (ref 3.5–5.2)
Sodium: 141 mmol/L (ref 134–144)
Total Protein: 7.3 g/dL (ref 6.0–8.5)
eGFR: 28 mL/min/{1.73_m2} — ABNORMAL LOW (ref >59)

## 2023-07-21 LAB — CBC
Hematocrit: 47 % — ABNORMAL HIGH (ref 34.0–46.6)
Hemoglobin: 15.1 g/dL (ref 11.1–15.9)
MCH: 30.4 pg (ref 26.6–33.0)
MCHC: 32.1 g/dL (ref 31.5–35.7)
MCV: 95 fL (ref 79–97)
Platelets: 237 10*3/uL (ref 150–450)
RBC: 4.96 x10E6/uL (ref 3.77–5.28)
RDW: 14.3 % (ref 11.7–15.4)
WBC: 9 10*3/uL (ref 3.4–10.8)

## 2023-07-21 LAB — MICROALBUMIN / CREATININE URINE RATIO

## 2023-07-21 LAB — HEMOGLOBIN A1C
Est. average glucose Bld gHb Est-mCnc: 117 mg/dL
Hgb A1c MFr Bld: 5.7 % — ABNORMAL HIGH (ref 4.8–5.6)

## 2023-07-21 LAB — SPECIMEN STATUS REPORT

## 2023-07-30 DIAGNOSIS — E785 Hyperlipidemia, unspecified: Secondary | ICD-10-CM | POA: Diagnosis not present

## 2023-07-30 DIAGNOSIS — I129 Hypertensive chronic kidney disease with stage 1 through stage 4 chronic kidney disease, or unspecified chronic kidney disease: Secondary | ICD-10-CM | POA: Diagnosis not present

## 2023-07-30 DIAGNOSIS — R7303 Prediabetes: Secondary | ICD-10-CM | POA: Diagnosis not present

## 2023-07-30 DIAGNOSIS — N184 Chronic kidney disease, stage 4 (severe): Secondary | ICD-10-CM | POA: Diagnosis not present

## 2023-08-04 ENCOUNTER — Other Ambulatory Visit (HOSPITAL_COMMUNITY): Payer: Self-pay | Admitting: Nephrology

## 2023-08-04 DIAGNOSIS — N1832 Chronic kidney disease, stage 3b: Secondary | ICD-10-CM

## 2023-08-13 ENCOUNTER — Ambulatory Visit (HOSPITAL_COMMUNITY)
Admission: RE | Admit: 2023-08-13 | Discharge: 2023-08-13 | Disposition: A | Discharge status: Home / Self Care | Payer: Medicare HMO | Source: Ambulatory Visit | Attending: Nephrology | Admitting: Nephrology

## 2023-08-13 DIAGNOSIS — N1832 Chronic kidney disease, stage 3b: Secondary | ICD-10-CM | POA: Insufficient documentation

## 2023-11-08 DIAGNOSIS — N184 Chronic kidney disease, stage 4 (severe): Secondary | ICD-10-CM | POA: Diagnosis not present

## 2023-11-08 DIAGNOSIS — I129 Hypertensive chronic kidney disease with stage 1 through stage 4 chronic kidney disease, or unspecified chronic kidney disease: Secondary | ICD-10-CM | POA: Diagnosis not present

## 2023-11-08 DIAGNOSIS — E875 Hyperkalemia: Secondary | ICD-10-CM | POA: Diagnosis not present

## 2023-11-08 DIAGNOSIS — N189 Chronic kidney disease, unspecified: Secondary | ICD-10-CM | POA: Diagnosis not present

## 2023-11-08 DIAGNOSIS — Z853 Personal history of malignant neoplasm of breast: Secondary | ICD-10-CM | POA: Diagnosis not present

## 2023-11-18 DIAGNOSIS — E875 Hyperkalemia: Secondary | ICD-10-CM | POA: Diagnosis not present

## 2023-11-18 DIAGNOSIS — N1832 Chronic kidney disease, stage 3b: Secondary | ICD-10-CM | POA: Diagnosis not present

## 2023-11-18 DIAGNOSIS — E559 Vitamin D deficiency, unspecified: Secondary | ICD-10-CM | POA: Diagnosis not present

## 2023-11-18 DIAGNOSIS — I129 Hypertensive chronic kidney disease with stage 1 through stage 4 chronic kidney disease, or unspecified chronic kidney disease: Secondary | ICD-10-CM | POA: Diagnosis not present

## 2023-11-22 ENCOUNTER — Other Ambulatory Visit: Payer: Self-pay | Admitting: Family Medicine

## 2023-11-22 DIAGNOSIS — E785 Hyperlipidemia, unspecified: Secondary | ICD-10-CM

## 2023-11-22 DIAGNOSIS — I10 Essential (primary) hypertension: Secondary | ICD-10-CM

## 2023-11-22 MED ORDER — AMLODIPINE BESYLATE 5 MG PO TABS: 5.0000 mg | ORAL_TABLET | Freq: Every day | ORAL | 3 refills | Status: AC

## 2023-11-22 MED ORDER — SIMVASTATIN 40 MG PO TABS: 40.0000 mg | ORAL_TABLET | Freq: Every day | ORAL | 3 refills | Status: AC

## 2023-11-22 NOTE — Telephone Encounter (Signed)
 Copied from CRM [id number]. Topic: Clinical - Medication Refill >> Nov 22, 2023  9:48 AM Fuller Mandril wrote: Most Recent Primary Care Visit:  Provider: Tommie Sams  Department: [facility]  Visit Type: OFFICE VISIT  Date: 07/19/2023  Medication:  simvastatin (ZOCOR) 40 MG tablet amLODipine (NORVASC) 5 MG tablet  Has the patient contacted their pharmacy? No - changed insurance and pharmacy (Agent: If no, request that the patient contact the pharmacy for the refill. If patient does not wish to contact the pharmacy document the reason why and proceed with request.) (Agent: If yes, when and what did the pharmacy advise?)  Is this the correct pharmacy for this prescription? No If no, delete pharmacy and type the correct one.  This is the patient's preferred pharmacy:  [address] [place], [place] [address] Phone: [phone]   Has the prescription been filled recently? No  Is the patient out of the medication? No  Has the patient been seen for an appointment in the last year OR does the patient have an upcoming appointment? Yes  Can we respond through MyChart? Yes  Agent: Please be advised that Rx refills may take up to 3 business days. We ask that you follow-up with your pharmacy.

## 2024-01-18 ENCOUNTER — Encounter: Payer: Self-pay | Admitting: Family Medicine

## 2024-01-18 ENCOUNTER — Ambulatory Visit: Payer: Medicare HMO | Admitting: Family Medicine

## 2024-01-18 VITALS — BP 137/86 | HR 75 | Temp 98.1°F | Ht 64.5 in | Wt 178.0 lb

## 2024-01-18 DIAGNOSIS — N1832 Chronic kidney disease, stage 3b: Secondary | ICD-10-CM

## 2024-01-18 DIAGNOSIS — I10 Essential (primary) hypertension: Secondary | ICD-10-CM | POA: Diagnosis not present

## 2024-01-18 DIAGNOSIS — E785 Hyperlipidemia, unspecified: Secondary | ICD-10-CM

## 2024-01-18 NOTE — Patient Instructions (Signed)
Continue your medications  Follow up in 1 year

## 2024-01-18 NOTE — Assessment & Plan Note (Addendum)
 Stable. Labs reviewed. Note from Nephrology reviewed.

## 2024-01-18 NOTE — Assessment & Plan Note (Signed)
Stable. Continue Amlodipine. 

## 2024-01-18 NOTE — Assessment & Plan Note (Signed)
At goal on Zocor. Continue. 

## 2024-01-18 NOTE — Progress Notes (Signed)
 Subjective:  Patient ID: Amber Hampton, female    DOB: December 04, 1940  Age: 83 y.o. MRN: [id number]  CC:   Chief Complaint  Patient presents with   Hypertension    Follow up sees nephrologist for kidney disease     HPI:  83 year old female with HTN, Hx of breast cancer, CKD (following with Nephrology), HLD presents for follow up.  Feeling well. No chest pain or SOB. She feels that she is doing well.   HTN stable on Amlodipine .  Lipids at goal on Zocor .   CKD stable (following with [facility]).  Patient Active Problem List   Diagnosis Date Noted   History of subarachnoid hemorrhage 07/19/2023   CKD (chronic kidney disease) stage 3, GFR 30-59 ml/min (HCC) 07/19/2023   Hyperlipidemia 06/18/2007   Essential hypertension 06/18/2007   History of breast cancer 06/18/2007    Social Hx   Social History   Socioeconomic History   Marital status: Married    Spouse name: Not on file   Number of children: Not on file   Years of education: Not on file   Highest education level: Not on file  Occupational History   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  Tobacco Use   Smoking status: Former    Current packs/day: 0.00    Types: Cigarettes    Quit date: 09/14/2006    Years since quitting: 17.3   Smokeless tobacco: Never  Vaping Use   Vaping status: Never Used  Substance and Sexual Activity   Alcohol use: No   Drug use: No   Sexual activity: Yes  Other Topics Concern   Not on file  Social History Narrative   Not on file   Social Drivers of Health   Financial Resource Strain: Low Risk  (03/12/2023)   Overall Financial Resource Strain (CARDIA)    Difficulty of Paying Living Expenses: Not hard at all  Food Insecurity: No Food Insecurity (03/12/2023)   Hunger Vital Sign    Worried About Running Out of Food in the Last Year: Never true    Ran Out of Food in the Last Year: Never true  Transportation Needs: No Transportation Needs (03/12/2023)   PRAPARE  - Administrator, Civil Service (Medical): No    Lack of Transportation (Non-Medical): No  Physical Activity: Sufficiently Active (03/12/2023)   Exercise Vital Sign    Days of Exercise per Week: 7 days    Minutes of Exercise per Session: 30 min  Stress: No Stress Concern Present (03/12/2023)   [employer] of Occupational Health - Occupational Stress Questionnaire    Feeling of Stress : Not at all  Social Connections: Moderately Integrated (03/12/2023)   Social Connection and Isolation Panel [NHANES]    Frequency of Communication with Friends and Family: More than three times a week    Frequency of Social Gatherings with Friends and Family: More than three times a week    Attends Religious Services: More than 4 times per year    Active Member of [employer] or Organizations: No    Attends Engineer, structural: Never    Marital Status: Married    Review of Systems Per HPI  Objective:  BP 137/86   Pulse 75   Temp 98.1 F (36.7 C)   Ht 5' 4.5" (1.638 m)   Wt 178 lb (80.7 kg)   SpO2 96%   BMI 30.08 kg/m      01/18/2024   10:07 AM  01/18/2024    9:42 AM 07/19/2023    9:42 AM  BP/Weight  Systolic BP 137 162 138  Diastolic BP 86 83 77  Wt. (Lbs)  178   BMI  30.08 kg/m2     Physical Exam Vitals and nursing note reviewed.  Constitutional:      General: She is not in acute distress.    Appearance: Normal appearance.  HENT:     Head: Normocephalic and atraumatic.  Eyes:     General:        Right eye: No discharge.        Left eye: No discharge.     Conjunctiva/sclera: Conjunctivae normal.  Cardiovascular:     Rate and Rhythm: Normal rate and regular rhythm.  Pulmonary:     Effort: Pulmonary effort is normal.     Breath sounds: Normal breath sounds. No wheezing, rhonchi or rales.  Neurological:     Mental Status: She is alert.  Psychiatric:        Mood and Affect: Mood normal.        Behavior: Behavior normal.     Lab Results  Component Value Date    WBC 9.0 07/19/2023   HGB 15.1 07/19/2023   HCT 47.0 (H) 07/19/2023   PLT 237 07/19/2023   GLUCOSE 86 07/19/2023   CHOL 151 07/19/2023   TRIG 106 07/19/2023   HDL 82 07/19/2023   LDLDIRECT 143.6 01/21/2009   LDLCALC 50 07/19/2023   ALT 11 07/19/2023   AST 17 07/19/2023   NA 141 07/19/2023   K 5.4 (H) 07/19/2023   CL 104 07/19/2023   CREATININE 1.80 (H) 07/19/2023   BUN 23 07/19/2023   CO2 21 07/19/2023   TSH 1.18 03/22/2015   HGBA1C 5.7 (H) 07/19/2023     Assessment & Plan:  Essential hypertension Assessment & Plan: Stable. Continue Amlodipine .   Hyperlipidemia, unspecified hyperlipidemia type Assessment & Plan: At goal on Zocor . Continue.   Stage 3b chronic kidney disease (HCC) Assessment & Plan: Stable. Labs reviewed. Note from Nephrology reviewed.     Follow-up:  1 year   Kathleen Papa DO [facility]

## 2024-02-22 ENCOUNTER — Telehealth: Admitting: Family Medicine

## 2024-02-22 DIAGNOSIS — J40 Bronchitis, not specified as acute or chronic: Secondary | ICD-10-CM

## 2024-02-22 DIAGNOSIS — R059 Cough, unspecified: Secondary | ICD-10-CM

## 2024-02-22 MED ORDER — AZITHROMYCIN 250 MG PO TABS: ORAL_TABLET | ORAL | 0 refills | Status: AC

## 2024-02-22 MED ORDER — BENZONATATE 100 MG PO CAPS: 100.0000 mg | ORAL_CAPSULE | Freq: Three times a day (TID) | ORAL | 0 refills | Status: AC | PRN

## 2024-02-22 NOTE — Progress Notes (Signed)
 E-Visit for Cough   We are sorry that you are not feeling well.  Here is how we plan to help!  Based on your presentation I believe you most likely have A cough due to bacteria.  When patients have a fever and a productive cough with a change in color or increased sputum production, we are concerned about bacterial bronchitis.  If left untreated it can progress to pneumonia.  If your symptoms do not improve with your treatment plan it is important that you contact your provider.   I have prescribed Azithromyin 250 mg: two tablets now and then one tablet daily for 4 additonal days    In addition you may use A non-prescription cough medication called Mucinex DM: take 2 tablets every 12 hours. and A prescription cough medication called Tessalon Perles 100mg . You may take 1-2 capsules every 8 hours as needed for your cough.   From your responses in the eVisit questionnaire you describe inflammation in the upper respiratory tract which is causing a significant cough.  This is commonly called Bronchitis and has four common causes:   Allergies Viral Infections Acid Reflux Bacterial Infection Allergies, viruses and acid reflux are treated by controlling symptoms or eliminating the cause. An example might be a cough caused by taking certain blood pressure medications. You stop the cough by changing the medication. Another example might be a cough caused by acid reflux. Controlling the reflux helps control the cough.  USE OF BRONCHODILATOR ("RESCUE") INHALERS: There is a risk from using your bronchodilator too frequently.  The risk is that over-reliance on a medication which only relaxes the muscles surrounding the breathing tubes can reduce the effectiveness of medications prescribed to reduce swelling and congestion of the tubes themselves.  Although you feel brief relief from the bronchodilator inhaler, your asthma may actually be worsening with the tubes becoming more swollen and filled with mucus.  This  can delay other crucial treatments, such as oral steroid medications. If you need to use a bronchodilator inhaler daily, several times per day, you should discuss this with your provider.  There are probably better treatments that could be used to keep your asthma under control.     HOME CARE Only take medications as instructed by your medical team. Complete the entire course of an antibiotic. Drink plenty of fluids and get plenty of rest. Avoid close contacts especially the very young and the elderly Cover your mouth if you cough or cough into your sleeve. Always remember to wash your hands A steam or ultrasonic humidifier can help congestion.   GET HELP RIGHT AWAY IF: You develop worsening fever. You become short of breath You cough up blood. Your symptoms persist after you have completed your treatment plan MAKE SURE YOU  Understand these instructions. Will watch your condition. Will get help right away if you are not doing well or get worse.    Thank you for choosing an e-visit.  Your e-visit answers were reviewed by a board certified advanced clinical practitioner to complete your personal care plan. Depending upon the condition, your plan could have included both over the counter or prescription medications.  Please review your pharmacy choice. Make sure the pharmacy is open so you can pick up prescription now. If there is a problem, you may contact your provider through [employer] and have the prescription routed to another pharmacy.  Your safety is important to us . If you have drug allergies check your prescription carefully.   For the next  24 hours you can use MyChart to ask questions about today's visit, request a non-urgent call back, or ask for a work or school excuse. You will get an email in the next two days asking about your experience. I hope that your e-visit has been valuable and will speed your recovery.  I have spent 5 minutes in review of e-visit  questionnaire, review and updating patient chart, medical decision making and response to patient.   Louvenia Roys, PA-C

## 2024-03-10 DIAGNOSIS — R809 Proteinuria, unspecified: Secondary | ICD-10-CM | POA: Diagnosis not present

## 2024-03-10 DIAGNOSIS — N189 Chronic kidney disease, unspecified: Secondary | ICD-10-CM | POA: Diagnosis not present

## 2024-03-10 DIAGNOSIS — D631 Anemia in chronic kidney disease: Secondary | ICD-10-CM | POA: Diagnosis not present

## 2024-03-22 DIAGNOSIS — I129 Hypertensive chronic kidney disease with stage 1 through stage 4 chronic kidney disease, or unspecified chronic kidney disease: Secondary | ICD-10-CM | POA: Diagnosis not present

## 2024-03-22 DIAGNOSIS — E559 Vitamin D deficiency, unspecified: Secondary | ICD-10-CM | POA: Diagnosis not present

## 2024-03-22 DIAGNOSIS — E875 Hyperkalemia: Secondary | ICD-10-CM | POA: Diagnosis not present

## 2024-03-22 DIAGNOSIS — N1832 Chronic kidney disease, stage 3b: Secondary | ICD-10-CM | POA: Diagnosis not present

## 2024-03-24 ENCOUNTER — Ambulatory Visit (INDEPENDENT_AMBULATORY_CARE_PROVIDER_SITE_OTHER): Payer: Medicare HMO

## 2024-03-24 VITALS — Ht 64.5 in | Wt 178.0 lb

## 2024-03-24 DIAGNOSIS — Z0000 Encounter for general adult medical examination without abnormal findings: Secondary | ICD-10-CM | POA: Diagnosis not present

## 2024-03-24 NOTE — Progress Notes (Signed)
 Subjective:   Amber Hampton is a 83 y.o. who presents for a Medicare Wellness preventive visit.  As a reminder, Annual Wellness Visits don't include a physical exam, and some assessments may be limited, especially if this visit is performed virtually. We may recommend an in-person follow-up visit with your provider if needed.  Visit Complete: Virtual I connected with  Amber Hampton on 03/24/24 by a audio enabled telemedicine application and verified that I am speaking with the correct person using two identifiers.  Patient Location: Home  Provider Location: Home Office  I discussed the limitations of evaluation and management by telemedicine. The patient expressed understanding and agreed to proceed.  Vital Signs: Because this visit was a virtual/telehealth visit, some criteria may be missing or patient reported. Any vitals not documented were not able to be obtained and vitals that have been documented are patient reported.  VideoDeclined- This patient declined Librarian, academic. Therefore the visit was completed with audio only.  Persons Participating in Visit: Patient.  AWV Questionnaire: No: Patient Medicare AWV questionnaire was not completed prior to this visit.  Cardiac Risk Factors include: advanced age (>37men, >77 women);dyslipidemia;hypertension     Objective:    Today's Vitals   03/24/24 0922  Weight: 178 lb (80.7 kg)  Height: 5' 4.5 (1.638 m)   Body mass index is 30.08 kg/m.     03/24/2024    9:25 AM 03/12/2023   10:03 AM 07/07/2022    5:03 PM 09/07/2018    1:12 PM 12/04/2015   11:25 AM 12/03/2015    8:44 AM 11/28/2015    5:34 PM  Advanced Directives  Does Patient Have a Medical Advance Directive? No No No Yes  No  No  No   Type of Advance Directive    Living will;Healthcare Power of Attorney     Would patient like information on creating a medical advance directive? Yes (MAU/Ambulatory/Procedural Areas - Information given) No -  Patient declined    No - patient declined information  No - patient declined information      Data saved with a previous flowsheet row definition    Current Medications (verified) Outpatient Encounter Medications as of 03/24/2024  Medication Sig   amLODipine  (NORVASC ) 5 MG tablet Take 1 tablet (5 mg total) by mouth daily.   simvastatin  (ZOCOR ) 40 MG tablet Take 1 tablet (40 mg total) by mouth daily.   No facility-administered encounter medications on file as of 03/24/2024.    Allergies (verified) Patient has no known allergies.   History: Past Medical History:  Diagnosis Date   ARTHRALGIA 01/12/2008   ASYMPTOMATIC POSTMENOPAUSAL STATUS 01/19/2008   BREAST CANCER, HX OF 06/18/2007   Cancer (HCC)    HYPERGLYCEMIA 06/18/2007   HYPERLIPIDEMIA 06/18/2007   HYPERTENSION 06/18/2007   Renal failure    TOBACCO USE, QUIT 01/30/2010   Vertigo    Past Surgical History:  Procedure Laterality Date   Aneurysm Right Side head  1996   s/p aneurysm clipping (NO MRI)   BREAST LUMPECTOMY Left    malignant   CEREBRAL ANEURYSM REPAIR  1996   clipping (NO MRI)   Family History  Problem Relation Age of Onset   Cancer Neg Hx        No FH except for pt   Social History   Socioeconomic History   Marital status: Married    Spouse name: Not on file   Number of children: Not on file   Years of education: Not  on file   Highest education level: Not on file  Occupational History   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  Tobacco Use   Smoking status: Former    Current packs/day: 0.00    Types: Cigarettes    Quit date: 09/14/2006    Years since quitting: 17.5   Smokeless tobacco: Never  Vaping Use   Vaping status: Never Used  Substance and Sexual Activity   Alcohol use: No   Drug use: No   Sexual activity: Yes  Other Topics Concern   Not on file  Social History Narrative   Not on file   Social Drivers of Health   Financial Resource Strain: Low Risk   (03/24/2024)   Overall Financial Resource Strain (CARDIA)    Difficulty of Paying Living Expenses: Not hard at all  Food Insecurity: No Food Insecurity (03/24/2024)   Hunger Vital Sign    Worried About Running Out of Food in the Last Year: Never true    Ran Out of Food in the Last Year: Never true  Transportation Needs: No Transportation Needs (03/24/2024)   PRAPARE - Administrator, Civil Service (Medical): No    Lack of Transportation (Non-Medical): No  Physical Activity: Sufficiently Active (03/24/2024)   Exercise Vital Sign    Days of Exercise per Week: 5 days    Minutes of Exercise per Session: 30 min  Stress: No Stress Concern Present (03/12/2023)   [employer] of Occupational Health - Occupational Stress Questionnaire    Feeling of Stress : Not at all  Social Connections: Moderately Integrated (03/24/2024)   Social Connection and Isolation Panel    Frequency of Communication with Friends and Family: More than three times a week    Frequency of Social Gatherings with Friends and Family: More than three times a week    Attends Religious Services: More than 4 times per year    Active Member of [employer] or Organizations: No    Attends Banker Meetings: Never    Marital Status: Married    Tobacco Counseling Counseling given: Not Answered    Clinical Intake:  Pre-visit preparation completed: Yes  Pain : No/denies pain     Diabetes: No  Lab Results  Component Value Date   HGBA1C 5.7 (H) 07/19/2023   HGBA1C 5.7 (H) 03/04/2022   HGBA1C 5.6 11/01/2020     How often do you need to have someone help you when you read instructions, pamphlets, or other written materials from your doctor or pharmacy?: 1 - Never  Interpreter Needed?: No  Information entered by :: Charmaine Bloodgood LPN   Activities of Daily Living     03/24/2024    9:24 AM  In your present state of health, do you have any difficulty performing the following activities:  Hearing? 0   Vision? 0  Difficulty concentrating or making decisions? 0  Walking or climbing stairs? 0  Dressing or bathing? 0  Doing errands, shopping? 0  Preparing Food and eating ? N  Using the Toilet? N  In the past six months, have you accidently leaked urine? N  Do you have problems with loss of bowel control? N  Managing your Medications? N  Managing your Finances? N  Housekeeping or managing your Housekeeping? N    Patient Care Team: Cook, Jayce G, DO as PCP - General (Family Medicine) Erasmo Bernardino BRAVO, OD (Optometry) Rachele Gaynell RAMAN, MD as Referring Physician (Nephrology)  I have updated your Care  Teams any recent Medical Services you may have received from other providers in the past year.     Assessment:   This is a routine wellness examination for [facility].  Hearing/Vision screen Hearing Screening - Comments:: Denies hearing difficulties   Vision Screening - Comments::  up to date with routine eye exams     Goals Addressed             This Visit's Progress    Patient Stated   On track    Patient wants to remain as active and healthy as she currently is.        Depression Screen     03/24/2024    9:25 AM 01/18/2024    9:44 AM 07/19/2023    9:43 AM 03/12/2023   10:03 AM 03/04/2022    1:08 PM 11/01/2020    1:46 PM  PHQ 2/9 Scores  PHQ - 2 Score 0 0 0 0 0 0  PHQ- 9 Score  0 0       Fall Risk     03/24/2024    9:24 AM 07/19/2023    9:43 AM 03/12/2023   10:03 AM 03/04/2022    1:07 PM  Fall Risk   Falls in the past year? 0 0 0 0  Number falls in past yr: 0 0 0 0  Injury with Fall? 0 0 0 0  Risk for fall due to : No Fall Risks No Fall Risks No Fall Risks No Fall Risks  Follow up Falls prevention discussed;Education provided;Falls evaluation completed Falls evaluation completed Falls prevention discussed Falls evaluation completed      Data saved with a previous flowsheet row definition    MEDICARE RISK AT HOME:  Medicare Risk at Home Any stairs in or around  the home?: No If so, are there any without handrails?: No Home free of loose throw rugs in walkways, pet beds, electrical cords, etc?: Yes Adequate lighting in your home to reduce risk of falls?: Yes Life alert?: No Use of a cane, walker or w/c?: Yes Grab bars in the bathroom?: Yes Shower chair or bench in shower?: No Elevated toilet seat or a handicapped toilet?: Yes  TIMED UP AND GO:  Was the test performed?  No  Cognitive Function: 6CIT completed        03/24/2024    9:28 AM 03/12/2023   10:05 AM  6CIT Screen  What Year? 0 points 0 points  What month? 0 points 0 points  What time? 0 points 0 points  Count back from 20 0 points 0 points  Months in reverse 2 points 4 points  Repeat phrase 0 points 2 points  Total Score 2 points 6 points    Immunizations Immunization History  Administered Date(s) Administered   Influenza, High Dose Seasonal PF 06/22/2017   Influenza-Unspecified 06/05/2016, 07/12/2020, 06/16/2023   PFIZER SARS-COV-2 Pediatric Vaccination 5-63yrs 10/06/2019, 11/03/2019, 07/12/2020   Pneumococcal Conjugate-13 03/22/2015   Pneumococcal Polysaccharide-23 01/19/2008   Td 01/21/2009   Zoster, Live 09/14/2012    Screening Tests Health Maintenance  Topic Date Due   Zoster Vaccines- Shingrix (1 of 2) 07/03/1992   DTaP/Tdap/Td (2 - Tdap) 01/22/2019   COVID-19 Vaccine (1 - 2024-25 season) 05/16/2023   INFLUENZA VACCINE  04/14/2024   Medicare Annual Wellness (AWV)  03/24/2025   Pneumococcal Vaccine: 50+ Years  Completed   DEXA SCAN  Completed   Hepatitis B Vaccines  Aged Out   HPV VACCINES  Aged Out   Meningococcal B Vaccine  Aged Out    Health Maintenance  Health Maintenance Due  Topic Date Due   Zoster Vaccines- Shingrix (1 of 2) 07/03/1992   DTaP/Tdap/Td (2 - Tdap) 01/22/2019   COVID-19 Vaccine (1 - 2024-25 season) 05/16/2023    Additional Screening:  Vision Screening: Recommended annual ophthalmology exams for early detection of glaucoma and  other disorders of the eye. Would you like a referral to an eye doctor? No    Dental Screening: Recommended annual dental exams for proper oral hygiene  Community Resource Referral / Chronic Care Management: CRR required this visit?  No   CCM required this visit?  No   Plan:    I have personally reviewed and noted the following in the patient's chart:   Medical and social history Use of alcohol, tobacco or illicit drugs  Current medications and supplements including opioid prescriptions. Patient is not currently taking opioid prescriptions. Functional ability and status Nutritional status Physical activity Advanced directives List of other physicians Hospitalizations, surgeries, and ER visits in previous 12 months Vitals Screenings to include cognitive, depression, and falls Referrals and appointments  In addition, I have reviewed and discussed with patient certain preventive protocols, quality metrics, and best practice recommendations. A written personalized care plan for preventive services as well as general preventive health recommendations were provided to patient.   Lavelle Pfeiffer [place], [place]   [id number]   After Visit Summary: (MyChart) Due to this being a telephonic visit, the after visit summary with patients personalized plan was offered to patient via MyChart   Notes: Nothing significant to report at this time.

## 2024-03-24 NOTE — Patient Instructions (Signed)
 Amber Hampton , Thank you for taking time out of your busy schedule to complete your Annual Wellness Visit with me. I enjoyed our conversation and look forward to speaking with you again next year. I, as well as your care team,  appreciate your ongoing commitment to your health goals. Please review the following plan we discussed and let me know if I can assist you in the future. Your Game plan/ To Do List    Follow up Visits: Next Medicare AWV with our clinical staff: In 1 year    Have you seen your provider in the last 6 months (3 months if uncontrolled diabetes)? Yes Next Office Visit with your provider: To be scheduled   Clinician Recommendations:  Aim for 30 minutes of exercise or brisk walking, 6-8 glasses of water, and 5 servings of fruits and vegetables each day.       This is a list of the screening recommended for you and due dates:  Health Maintenance  Topic Date Due   Zoster (Shingles) Vaccine (1 of 2) 07/03/1992   DTaP/Tdap/Td vaccine (2 - Tdap) 01/22/2019   COVID-19 Vaccine (1 - 2024-25 season) 05/16/2023   Flu Shot  04/14/2024   Medicare Annual Wellness Visit  03/24/2025   Pneumococcal Vaccine for age over 61  Completed   DEXA scan (bone density measurement)  Completed   Hepatitis B Vaccine  Aged Out   HPV Vaccine  Aged Out   Meningitis B Vaccine  Aged Out    Advanced directives: (ACP Link)Information on Advanced Care Planning can be found at [employer] [facility] [employer] Advance Health Care Directives Advance Health Care Directives. [email or web address]   Advance Care Planning is important because it:  [x]  Makes sure you receive the medical care that is consistent with your values, goals, and preferences  [x]  It provides guidance to your family and loved ones and reduces their decisional burden about whether or not they are making the right decisions based on your wishes.  Follow the link provided in your after visit summary or read over the paperwork we have mailed to you to  help you started getting your Advance Directives in place. If you need assistance in completing these, please reach out to us  so that we can help you!  See attachments for Preventive Care and Fall Prevention Tips.

## 2024-04-05 DIAGNOSIS — D631 Anemia in chronic kidney disease: Secondary | ICD-10-CM | POA: Diagnosis not present

## 2024-04-05 DIAGNOSIS — N189 Chronic kidney disease, unspecified: Secondary | ICD-10-CM | POA: Diagnosis not present

## 2024-04-05 DIAGNOSIS — R809 Proteinuria, unspecified: Secondary | ICD-10-CM | POA: Diagnosis not present

## 2024-04-05 DIAGNOSIS — E211 Secondary hyperparathyroidism, not elsewhere classified: Secondary | ICD-10-CM | POA: Diagnosis not present

## 2024-04-07 DIAGNOSIS — N1832 Chronic kidney disease, stage 3b: Secondary | ICD-10-CM | POA: Diagnosis not present

## 2024-04-07 DIAGNOSIS — N2581 Secondary hyperparathyroidism of renal origin: Secondary | ICD-10-CM | POA: Diagnosis not present

## 2024-04-07 DIAGNOSIS — E875 Hyperkalemia: Secondary | ICD-10-CM | POA: Diagnosis not present

## 2024-04-07 DIAGNOSIS — I129 Hypertensive chronic kidney disease with stage 1 through stage 4 chronic kidney disease, or unspecified chronic kidney disease: Secondary | ICD-10-CM | POA: Diagnosis not present

## 2024-07-05 DIAGNOSIS — M25551 Pain in right hip: Secondary | ICD-10-CM | POA: Diagnosis not present

## 2024-07-05 DIAGNOSIS — M545 Low back pain, unspecified: Secondary | ICD-10-CM | POA: Diagnosis not present

## 2024-07-21 DIAGNOSIS — N189 Chronic kidney disease, unspecified: Secondary | ICD-10-CM | POA: Diagnosis not present

## 2024-07-21 DIAGNOSIS — E875 Hyperkalemia: Secondary | ICD-10-CM | POA: Diagnosis not present

## 2024-07-28 DIAGNOSIS — E559 Vitamin D deficiency, unspecified: Secondary | ICD-10-CM | POA: Diagnosis not present

## 2024-07-28 DIAGNOSIS — I129 Hypertensive chronic kidney disease with stage 1 through stage 4 chronic kidney disease, or unspecified chronic kidney disease: Secondary | ICD-10-CM | POA: Diagnosis not present

## 2024-07-28 DIAGNOSIS — N1832 Chronic kidney disease, stage 3b: Secondary | ICD-10-CM | POA: Diagnosis not present

## 2024-07-28 DIAGNOSIS — N2581 Secondary hyperparathyroidism of renal origin: Secondary | ICD-10-CM | POA: Diagnosis not present

## 2024-08-14 DIAGNOSIS — E7849 Other hyperlipidemia: Secondary | ICD-10-CM | POA: Diagnosis not present

## 2024-08-14 DIAGNOSIS — M5416 Radiculopathy, lumbar region: Secondary | ICD-10-CM | POA: Diagnosis not present

## 2024-08-14 DIAGNOSIS — I700 Atherosclerosis of aorta: Secondary | ICD-10-CM | POA: Diagnosis not present

## 2024-08-16 DIAGNOSIS — M5416 Radiculopathy, lumbar region: Secondary | ICD-10-CM | POA: Diagnosis not present

## 2025-03-30 ENCOUNTER — Ambulatory Visit
